# Patient Record
Sex: Female | Born: 1980 | Hispanic: Refuse to answer | Marital: Single | Smoking: Former smoker
Health system: Southern US, Community
[De-identification: ages and names within clinical notes are randomized; demographics above are authoritative.]

## PROBLEM LIST (undated history)

## (undated) DIAGNOSIS — Z789 Other specified health status: Secondary | ICD-10-CM

## (undated) HISTORY — PX: APPENDECTOMY: SHX54

---
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
## 2002-08-15 ENCOUNTER — Inpatient Hospital Stay (HOSPITAL_COMMUNITY): Admission: AD | Admit: 2002-08-15 | Discharge: 2002-08-18 | Payer: Self-pay | Admitting: Obstetrics

## 2002-08-28 ENCOUNTER — Inpatient Hospital Stay (HOSPITAL_COMMUNITY): Admission: AD | Admit: 2002-08-28 | Discharge: 2002-08-28 | Payer: Self-pay | Admitting: Obstetrics

## 2003-09-13 ENCOUNTER — Ambulatory Visit (HOSPITAL_COMMUNITY): Admission: RE | Admit: 2003-09-13 | Discharge: 2003-09-13 | Payer: Self-pay | Admitting: *Deleted

## 2003-12-26 ENCOUNTER — Inpatient Hospital Stay (HOSPITAL_COMMUNITY): Admission: AD | Admit: 2003-12-26 | Discharge: 2003-12-30 | Payer: Self-pay | Admitting: *Deleted

## 2003-12-27 ENCOUNTER — Encounter (INDEPENDENT_AMBULATORY_CARE_PROVIDER_SITE_OTHER): Payer: Self-pay | Admitting: Specialist

## 2010-05-05 ENCOUNTER — Emergency Department (HOSPITAL_COMMUNITY): Admission: EM | Admit: 2010-05-05 | Discharge: 2010-05-05 | Payer: Self-pay | Admitting: Emergency Medicine

## 2011-02-27 NOTE — Op Note (Signed)
NAME:  Linda Massey, Linda Massey                          ACCOUNT NO.:  [id number]   MEDICAL RECORD NO.:  [id number]                   PATIENT TYPE:  INP   LOCATION:  9315                                 FACILITY:  [facility]   PHYSICIAN:  Phil D. Okey Dupre, M.D.                  DATE OF BIRTH:  1981/09/10   DATE OF PROCEDURE:  12/27/2003  DATE OF DISCHARGE:                                 OPERATIVE REPORT   PREOPERATIVE DIAGNOSES:  Failed vaginal birth after cesarean section with  direct posterior malpresentation and nonreassuring fetal heart pattern.   POSTOPERATIVE DIAGNOSES:  Failed vaginal birth after cesarean section with  direct posterior malpresentation and nonreassuring fetal heart pattern.   PROCEDURE:  Low transverse cesarean section.   ANESTHESIA:  Epidural.   ESTIMATED BLOOD LOSS:  1000 mL plus.   SURGEON:  Javier Glazier. Okey Dupre, M.D.   POSTPARTUM CONDITION:  At this time satisfactory.   The procedure went as follows:  Under satisfactory epidural anesthesia with  the patient in dorsal supine position, Foley catheter in urinary bladder,  the abdomen was prepped and draped in a routine manner and entered through a  transverse low abdominal scar situated 0.5 cm above a previous C-section  scar.  The abdomen was entered in Pfannenstiel method.  On entering the  peritoneal cavity the visceral peritoneum on the anterior surface of the  uterus was opened transversely and the bladder pushed away from the lower  uterine segment.  It was entered by sharp and blunt dissection.  The baby  was jammed into the pelvis in a direct posterior presentation and we brought  up with some difficulty, and once the baby was brought out of the pelvis,  delivery through the uterine incision was quite easy.  The cord was doubly  clamped, divided, and the baby handed to the pediatrician and a little  suction with the bulb suction and cried spontaneously.  Then samples of  blood were taken for analysis and the placenta was  spontaneously removed.  The uterus was explored and there was a significant extension on the left  side __________ of the uterus, which was grasped with a ring forceps and the  uterus was then closed with a continuous running locked 0 Vicryl suture on  an atraumatic needle.  After the closure it was noted that she was still  bleeding significantly from that left side and the scrub nurse as much as  possible gave me exposure, allowed several figure-of-eight sutures to be put  in there, and seemed to control the bleeding quite well.  The area was  observed for bleeding and no further bleeding was noted of any significance.  The omentum that kept coming down was packed away with wet tapes and the  areas observed under five to seven minutes and no active bleeding noted.  The fascia was then closed after the tape, instrument, and sponge count  were  reported correct with a continuous running 0 Vicryl on an atraumatic needle  and the skin edge approximated with skin staples after the subcutaneous  bleeders were controlled with hot cautery.  A dry sterile dressing was  applied.  Tape, instrument, sponge, and needle count were reported correct  at the end of the procedure and the Foley catheter was draining clear amber  urine and had  collected 250 mL.  Estimated blood loss during the procedure by anesthesia  was 1000 mL, thought I thought it was at least 250 or 500 more than that.  The uterus was firm at the end of the procedure.  A dry sterile dressing was  applied and the patient transferred to the recovery room in satisfactory  condition.                                               Phil D. Okey Dupre, M.D.    PDR/MEDQ  D:  12/27/2003  T:  12/28/2003  Job:  [id number]

## 2011-02-27 NOTE — H&P (Signed)
   NAME:  Linda Massey, Linda Massey                          ACCOUNT NO.:  [id number]   MEDICAL RECORD NO.:  [id number]                   PATIENT TYPE:  INP   LOCATION:  9170                                 [facility]ACILITY:  [facility]   PHYSICIAN:  Kathreen Cosier, M.D.           DATE OF BIRTH:  1980-11-19   DATE OF ADMISSION:  08/15/2002  DATE OF DISCHARGE:                                HISTORY & PHYSICAL   HISTORY OF PRESENT ILLNESS:  The patient is a 30 year old primigravida, [facility]  August 19, 2002 admitted in early labor on the morning of November 4.  Cervix 1 cm, 70%, vertex, -3.  Positive GBS treated at 36 weeks with  ampicillin and penicillin regimen in the hospital.  Membranes were ruptured  artificially at 9:45 a.m.  The fluid was clear.  The patient received  Pitocin stimulation starting at 12:10 p.m.  At 8:30 p.m. she was 6 cm  dilated and by 11 p.m. she was unchanged with the vertex at -3 station with  a lot of molding.  It was decided she would be delivered by cesarean section  because of CPD.   PHYSICAL EXAMINATION:  GENERAL:  Well-developed female in labor.  HEENT:  Negative.  LUNGS:  Clear.  HEART:  Regular rhythm.  No murmurs.  No gallops.  ABDOMEN:  Term size uterus.  Estimated fetal weight was 7 pounds 2 ounces.  EXTREMITIES:  Negative.                                               Kathreen Cosier, M.D.    BAM/MEDQ  D:  08/16/2002  T:  08/16/2002  Job:  [id number]

## 2011-02-27 NOTE — Op Note (Signed)
   NAME:  Linda Massey, Linda Massey                          ACCOUNT NO.:  [id number]   MEDICAL RECORD NO.:  [id number]                   PATIENT TYPE:  INP   LOCATION:  9170                                 [facility]   PHYSICIAN:  Kathreen Cosier, M.D.           DATE OF BIRTH:  06/04/1981   DATE OF PROCEDURE:  08/15/2002  DATE OF DISCHARGE:                                 OPERATIVE REPORT   PREOPERATIVE DIAGNOSES:  Failure to progress in labor.   SURGEON:  Kathreen Cosier, M.D.   PROCEDURE:  The patient placed on the operating table in supine position.  Abdomen prepped and draped.  Bladder emptied with Foley catheter.  Transverse suprapubic incision made, carried down to the rectus fascia.  Fascia cleaned and incised the length of the incision.  Recti muscles  retracted laterally.  Peritoneum incised longitudinally.  Transverse  incision made on the visceroperitoneum above the bladder and the bladder  mobilized inferiorly.  Transverse lower uterine incision made.  The patient  delivered from the OP position of a female Apgar 9/9 weighing 7 pounds 7  ounces.  The placenta was anterior, removed manually.  Uterine cavity  cleaned with dry laps.  The fluid was clear.  The uterus closed in one layer  with continuous suture of number 1 chromic.  Hemostasis satisfactory.  Bladder flap reattached with 2-0 chromic.  Uterus well contracted.  Tubes  and ovaries normal.  Abdomen closed in layers.  Peritoneum continuous suture  of 0 chromic.  Fascia continuous suture of 0 Dexon.  Skin closed with  subcuticular suture of 3-0 plain.  Blood loss 300 cc.  The patient tolerated  procedure well.  Taken to recovery room in good condition.                                               Kathreen Cosier, M.D.    BAM/MEDQ  D:  08/16/2002  T:  08/16/2002  Job:  [id number]

## 2011-02-27 NOTE — Discharge Summary (Signed)
   NAME:  SKILA, ROLLINS                          ACCOUNT NO.:  [id number]   MEDICAL RECORD NO.:  [id number]                   PATIENT TYPE:  INP   LOCATION:  9107                                 [facility]ILITY[facility]   PHYSICIAN:  Kathreen Cosier, M.D.           DATE OF BIRTH:  Mar 27, 1981   DATE OF ADMISSION:  08/15/2002  DATE OF DISCHARGE:                                 DISCHARGE SUMMARY   HOSPITAL COURSE:  The patient is a 30 year old primigravida, [facility] 08/19/2002.  Admitted in labor, 1 cm, 70%, vertex, -3, positive GBS, treated with  ampicillin.  The patient underwent primary low transverse cesarean section  because of failure to progress in labor.  She delivered a female Apgar 9-9,  __________ from the OP position.  Placenta was anterior.   On admission, her hemoglobin was 10.6, platelets 228.  Postoperative 9.4,  194.   She did well and was discharged home on the third postoperative day  __________ on a regular diet.  On Tylox, one every 3-4 hours p.r.n.  To see  me in six weeks.   DIAGNOSIS:  Status post primary low transverse cesarean section at term.                                                Kathreen Cosier, M.D.    BAM/MEDQ  D:  08/18/2002  T:  08/20/2002  Job:  [id number]

## 2011-02-27 NOTE — Discharge Summary (Signed)
NAME:  Linda Massey, Linda Massey                          ACCOUNT NO.:  [id number]   MEDICAL RECORD NO.:  [id number]                   PATIENT TYPE:  INP   LOCATION:  9315                                 [facility]   PHYSICIAN:  Phil D. Okey Dupre, M.D.                  DATE OF BIRTH:  [birth year]   DATE OF ADMISSION:  12/26/2003  DATE OF DISCHARGE:  12/30/2003                                 DISCHARGE SUMMARY   ADMISSION DIAGNOSES:  1. Gravida 2, para 1-0-0-1 at 40-6/7 weeks with history of cesarean section     desiring trial of labor.  2. Early labor.   DISCHARGE DIAGNOSES:  1. Failed trial of labor.  2. Nonreassuring fetal heart rate.  3. Placenta posterior positioning.  4. Repeat low transverse cesarean section.   HISTORY OF PRESENT ILLNESS:  This is a 31 year old G2, P1-0-0-1 who  presented at 40-6/7 weeks dated by 26-week ultrasound.  She presented with  contractions every two to three minutes in early labor with spontaneous  rupture of membranes and clear fluid.  She had no bleeding and good fetal  activity.  She was desirous of trial of labor after cesarean.  Her prenatal  care was at [facility], onset about 26 weeks, and uncomplicated course.   MEDICATIONS:  Vitamins _________.   OBSTETRICAL HISTORY:  Low transverse cesarean section in November of 2003 at  term for arrest of progress.  Birth weight was 7 pounds 8 ounces.   GYNECOLOGICAL HISTORY:  Noncontributory.   PAST MEDICAL HISTORY:  History of periumbilical hernia.   PAST SURGICAL HISTORY:  Low transverse cesarean section, Dr. Kathreen Cosier.   FAMILY HISTORY:  Paternal uncle with heart defect.  Mother with  hypertension.  Negative tobacco, alcohol, or drugs.  Rh positive.  Rubella  immune.  GBS was negative on November 01, 2003.  Her Glucola was 103.   ADMISSION PHYSICAL EXAMINATION:  VITAL SIGNS:  Temperature 99, blood  pressure 137/90 initially.  LUNGS:  Clear.  CARDIOVASCULAR:  Regular rate and rhythm.  ABDOMEN:  Term size and gravid.  PELVIC:  Digital examination by Dr. Okey Dupre was 3, 80%, -2 with gross amniotic  fluid.  Fetal heart rate was 140, reactive, good variability and  contractions were every two to three minutes.   HOSPITAL COURSE:  The patient was initially given IV anesthesia and blood  pressures were remaining in the 137 to 162/75 to 91 range so PIH labs were  drawn as well as catheterized urine for protein.  These labs were all well  within normal limits and there was negative proteinuria.  She had no  preeclampsia symptoms and no hyperreflexia.  She progressed in early labor  and mild gestational hypertension remained stable.  Her records were  reviewed.  Her maximum dilatation with the 2003 delivery was 6 and there was  molding and she remained at 6 for several  hours before her primary cesarean  section.  She had mild variables but was progressing in early labor.  When  she became 4 cm, 100% she was started on low dose Pitocin.  Again, this was  initially stopped due to mild repetitive variables.  IUPC and IFSE were  placed in active labor and [place] units were adequate at 200.  She  remained at 8 cm and -2 station for approximately two hours with 4+ caput  __________ direct OP positioning.  There was decreased variabilities of the  fetal heart rate and a decision was made to proceed with second low  transverse cesarean section due to nonreassuring fetal heart rate,  persistent posterior presentation and she went to the OR with epidural  anesthesia effective.  Her estimated blood loss was 1000.  A low transverse  cesarean section was done.   Postoperatively she did well.  Her hemoglobin was 11.4.  Vital signs  remained stable.   On postoperative day #3, her pain was well controlled.  She had no  orthostatic symptoms, was tolerating regular diet and had normal bowel  function.  She had changed to bottle-feeding.  Vital signs remained stable.  It was noted that there  was Trichomonas on her urinalysis.  Abdomen was  soft, nondistended, appropriate tender and involuting incision with staples  clean, dry and intact.  Decision was made to discharge home on Percocet,  ibuprofen, prenatal vitamins, iron, Depo-Provera and Flagyl 2 g p.o. for her  Trichomonas infection.  Staples were removed from the incision predischarge.  She was to follow up at [facility] in six weeks and discharged home in  good condition.     Deirdre Christy Gentles, C.N.M.                       Phil D. Okey Dupre, M.D.    DP/MEDQ  D:  02/25/2004  T:  02/25/2004  Job:  [id number]

## 2011-10-23 ENCOUNTER — Encounter (HOSPITAL_COMMUNITY): Payer: Self-pay | Admitting: *Deleted

## 2011-10-23 ENCOUNTER — Emergency Department (HOSPITAL_COMMUNITY)
Admission: EM | Admit: 2011-10-23 | Discharge: 2011-10-23 | Disposition: A | Discharge status: Home / Self Care | Payer: Self-pay | Attending: Emergency Medicine | Admitting: Emergency Medicine

## 2011-10-23 ENCOUNTER — Emergency Department (HOSPITAL_COMMUNITY): Payer: Self-pay

## 2011-10-23 DIAGNOSIS — R0789 Other chest pain: Secondary | ICD-10-CM

## 2011-10-23 DIAGNOSIS — R071 Chest pain on breathing: Secondary | ICD-10-CM | POA: Insufficient documentation

## 2011-10-23 DIAGNOSIS — R509 Fever, unspecified: Secondary | ICD-10-CM | POA: Insufficient documentation

## 2011-10-23 DIAGNOSIS — R1110 Vomiting, unspecified: Secondary | ICD-10-CM | POA: Insufficient documentation

## 2011-10-23 DIAGNOSIS — R059 Cough, unspecified: Secondary | ICD-10-CM | POA: Insufficient documentation

## 2011-10-23 DIAGNOSIS — F17200 Nicotine dependence, unspecified, uncomplicated: Secondary | ICD-10-CM | POA: Insufficient documentation

## 2011-10-23 DIAGNOSIS — N390 Urinary tract infection, site not specified: Secondary | ICD-10-CM | POA: Insufficient documentation

## 2011-10-23 DIAGNOSIS — R51 Headache: Secondary | ICD-10-CM | POA: Insufficient documentation

## 2011-10-23 DIAGNOSIS — R05 Cough: Secondary | ICD-10-CM | POA: Insufficient documentation

## 2011-10-23 DIAGNOSIS — B349 Viral infection, unspecified: Secondary | ICD-10-CM

## 2011-10-23 DIAGNOSIS — B9789 Other viral agents as the cause of diseases classified elsewhere: Secondary | ICD-10-CM | POA: Insufficient documentation

## 2011-10-23 DIAGNOSIS — R0602 Shortness of breath: Secondary | ICD-10-CM | POA: Insufficient documentation

## 2011-10-23 LAB — URINALYSIS, ROUTINE W REFLEX MICROSCOPIC
Bilirubin Urine: NEGATIVE
Glucose, UA: NEGATIVE mg/dL
Hgb urine dipstick: NEGATIVE
Ketones, ur: NEGATIVE mg/dL
Nitrite: NEGATIVE
Protein, ur: NEGATIVE mg/dL
Specific Gravity, Urine: 1.023 (ref 1.005–1.030)
Urobilinogen, UA: 1 mg/dL (ref 0.0–1.0)
pH: 8 (ref 5.0–8.0)

## 2011-10-23 LAB — URINE MICROSCOPIC-ADD ON

## 2011-10-23 LAB — POCT PREGNANCY, URINE: Preg Test, Ur: NEGATIVE

## 2011-10-23 MED ORDER — HYDROCOD POLST-CHLORPHEN POLST 10-8 MG/5ML PO LQCR: 5.0000 mL | Freq: Two times a day (BID) | ORAL | Status: DC | PRN

## 2011-10-23 MED ORDER — ACETAMINOPHEN 325 MG PO TABS
650.0000 mg | ORAL_TABLET | Freq: Once | ORAL | Status: AC
  Administered 2011-10-23: 650 mg via ORAL
  Filled 2011-10-23: qty 2

## 2011-10-23 MED ORDER — HYDROCOD POLST-CHLORPHEN POLST 10-8 MG/5ML PO LQCR
5.0000 mL | Freq: Once | ORAL | Status: AC
  Administered 2011-10-23: 5 mL via ORAL
  Filled 2011-10-23: qty 5

## 2011-10-23 MED ORDER — IPRATROPIUM BROMIDE 0.02 % IN SOLN
0.5000 mg | Freq: Once | RESPIRATORY_TRACT | Status: AC
  Administered 2011-10-23: 0.5 mg via RESPIRATORY_TRACT
  Filled 2011-10-23: qty 2.5

## 2011-10-23 MED ORDER — NITROFURANTOIN MONOHYD MACRO 100 MG PO CAPS: 100.0000 mg | ORAL_CAPSULE | Freq: Two times a day (BID) | ORAL | Status: AC

## 2011-10-23 MED ORDER — ALBUTEROL SULFATE (5 MG/ML) 0.5% IN NEBU
5.0000 mg | INHALATION_SOLUTION | Freq: Once | RESPIRATORY_TRACT | Status: AC
  Administered 2011-10-23: 5 mg via RESPIRATORY_TRACT
  Filled 2011-10-23: qty 1

## 2011-10-23 MED ORDER — ONDANSETRON 4 MG PO TBDP
8.0000 mg | ORAL_TABLET | Freq: Once | ORAL | Status: AC
  Administered 2011-10-23: 8 mg via ORAL
  Filled 2011-10-23: qty 2

## 2011-10-23 NOTE — ED Notes (Signed)
Patient states that she has been feeling bad since yesterday morning at 8am  States she has been having trouble breathing at times.  Sounds congested at this time  Also stated that she vomited earlier today.

## 2011-10-23 NOTE — ED Provider Notes (Signed)
Medical screening examination/treatment/procedure(s) were performed by non-physician practitioner and as supervising physician I was immediately available for consultation/collaboration.  Emi Lymon K Cali Cuartas-Rasch, MD 10/23/11 2322 

## 2011-10-23 NOTE — ED Notes (Signed)
The pt is c/o nausea and vomiting since 0800am no diarrhea.  She has started having chest pain after the vomiting

## 2011-10-23 NOTE — ED Provider Notes (Signed)
History     CSN: [id number]  Arrival date & time 10/23/11  0007   First MD Initiated Contact with Patient 10/23/11 0315      Chief Complaint  Patient presents with  . Emesis    HPI: Patient is a 31 y.o. female presenting with cough.  Cough This is a new problem. The current episode started 12 to 24 hours ago. The problem occurs every few minutes. The problem has been gradually worsening. The cough is non-productive. There has been no fever. Associated symptoms include chest pain and headaches. Pertinent negatives include no chills, no ear pain, no sore throat, no myalgias, no shortness of breath and no wheezing. She has tried nothing for the symptoms.  Pt reports onset of persistent "hard" cough yesterday. States she coughs to point of vomiting. This has happened several times since onset. Also states her chest hurts when she coughs. Now states has headache from coughing.  History reviewed. No pertinent past medical history.  History reviewed. No pertinent past surgical history.  History reviewed. No pertinent family history.  History  Substance Use Topics  . Smoking status: Current Everyday Smoker  . Smokeless tobacco: Not on file  . Alcohol Use: No    OB History    Grav Para Term Preterm Abortions TAB SAB Ect Mult Living                  Review of Systems  Constitutional: Negative for fever and chills.  HENT: Positive for congestion. Negative for ear pain and sore throat.   Respiratory: Positive for cough. Negative for shortness of breath and wheezing.   Cardiovascular: Positive for chest pain.  Gastrointestinal: Positive for vomiting. Negative for abdominal pain, diarrhea and anal bleeding.  Genitourinary: Negative for dysuria and vaginal discharge.  Musculoskeletal: Negative for myalgias.  Neurological: Positive for headaches.    Allergies  Review of patient's allergies indicates no known allergies.  Home Medications   Current Outpatient Rx  Name Route Sig  Dispense Refill  . NAPROXEN SODIUM 220 MG PO TABS Oral Take 660 mg by mouth once. Took once for chest pain    . HYDROCOD POLST-CPM POLST ER 10-8 MG/5ML PO LQCR Oral Take 5 mLs by mouth every 12 (twelve) hours as needed. 30 mL 0  . NITROFURANTOIN MONOHYD MACRO 100 MG PO CAPS Oral Take 1 capsule (100 mg total) by mouth 2 (two) times daily. 14 capsule 0    BP 117/74  Pulse 99  Temp(Src) 99.7 F (37.6 C) (Oral)  Resp 20  SpO2 99%  LMP 10/07/2011  Physical Exam  Constitutional: She is oriented to person, place, and time. She appears well-developed and well-nourished.  HENT:  Head: Normocephalic and atraumatic.  Eyes: Conjunctivae are normal.  Neck: Neck supple.  Cardiovascular: Normal rate and regular rhythm.   Pulmonary/Chest: Effort normal and breath sounds normal.       Frequent coughing. Mild chest wall TTP  Abdominal: Soft. Bowel sounds are normal.  Musculoskeletal: Normal range of motion.  Neurological: She is alert and oriented to person, place, and time.  Skin: Skin is warm and dry. No erythema.  Psychiatric: She has a normal mood and affect.    ED Course  Procedures Pt reports some relief of coughing w/ neb and medication for cough.Some improvement in headache. Findings and clinical impression discussed w/ pt. Pt continues to deny dysuria, abd pain or vag d/c. Will plan for d/c home w/ tx for UTI and medication for cough and provide PCP  referrals. Pt agreeable w/ plan.  Labs Reviewed  URINALYSIS, ROUTINE W REFLEX MICROSCOPIC - Abnormal; Notable for the following:    APPearance CLOUDY (*)    Leukocytes, UA LARGE (*)    All other components within normal limits  URINE MICROSCOPIC-ADD ON - Abnormal; Notable for the following:    Squamous Epithelial / LPF MANY (*)    Bacteria, UA MANY (*)    All other components within normal limits  POCT PREGNANCY, URINE  POCT PREGNANCY, URINE  I-STAT, CHEM 8   Dg Chest 2 View  10/23/2011  *RADIOLOGY REPORT*  Clinical Data: Cough.   Short of breath.  Fever.  CHEST - 2 VIEW  Comparison: 10/23/2011.  Findings:  Cardiopericardial silhouette within normal limits. Mediastinal contours normal. Trachea midline.  No airspace disease or effusion. No interval change.  IMPRESSION: Negative two-view chest.  Original Report Authenticated By: Andreas Newport, M.D.   Dg Chest 2 View  10/23/2011  *RADIOLOGY REPORT*  Clinical Data: Cough with fever for 2 days.  CHEST - 2 VIEW  Comparison: None.  Findings:  Cardiopericardial silhouette within normal limits. Mediastinal contours normal. Trachea midline.  No airspace disease or effusion.  IMPRESSION: No acute cardiopulmonary disease.  Original Report Authenticated By: Andreas Newport, M.D.     1. UTI (lower urinary tract infection)   2. Cough   3. Upper respiratory infection       MDM  HPI/PE and clinical findings c/w  1. Cough/headache likely viral syndrome (pt w/ low grade fever, not tachycardic and sats 96% on R/A) 2. UTI  3. Chest wall pain (likely due to peristent cough)        Leanne Chang, NP 10/23/11 [id number]

## 2011-10-23 NOTE — ED Notes (Signed)
Pt c/o a migraine headache 

## 2012-06-06 ENCOUNTER — Encounter (HOSPITAL_COMMUNITY): Payer: Self-pay

## 2012-06-06 ENCOUNTER — Emergency Department (HOSPITAL_COMMUNITY): Payer: Medicaid Other

## 2012-06-06 ENCOUNTER — Emergency Department (HOSPITAL_COMMUNITY)
Admission: EM | Admit: 2012-06-06 | Discharge: 2012-06-06 | Disposition: A | Discharge status: Home / Self Care | Payer: Medicaid Other | Attending: Emergency Medicine | Admitting: Emergency Medicine

## 2012-06-06 DIAGNOSIS — M25539 Pain in unspecified wrist: Secondary | ICD-10-CM | POA: Insufficient documentation

## 2012-06-06 DIAGNOSIS — F17200 Nicotine dependence, unspecified, uncomplicated: Secondary | ICD-10-CM | POA: Insufficient documentation

## 2012-06-06 DIAGNOSIS — M25531 Pain in right wrist: Secondary | ICD-10-CM

## 2012-06-06 MED ORDER — IBUPROFEN 400 MG PO TABS
800.0000 mg | ORAL_TABLET | Freq: Once | ORAL | Status: AC
  Administered 2012-06-06: 800 mg via ORAL
  Filled 2012-06-06: qty 2

## 2012-06-06 MED ORDER — IBUPROFEN 800 MG PO TABS: 800.0000 mg | ORAL_TABLET | Freq: Three times a day (TID) | ORAL | Status: AC | PRN
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
## 2012-06-06 NOTE — ED Notes (Signed)
Returned from  X-ray
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
## 2012-06-06 NOTE — Progress Notes (Signed)
Orthopedic Tech Progress Note Patient Details:  Linda Massey 1981/02/25 [id number]  Ortho Devices Type of Ortho Device: Ace wrap Ortho Device/Splint Location: right hand Ortho Device/Splint Interventions: Application   [employer] T 06/06/2012, 2:49 PM

## 2012-06-06 NOTE — ED Notes (Signed)
Ortho tech notified of need for ace wrap

## 2012-06-07 NOTE — ED Provider Notes (Signed)
Medical screening examination/treatment/procedure(s) were performed by non-physician practitioner and as supervising physician I was immediately available for consultation/collaboration.   Mercadez Heitman Y. Reannon Candella, MD 06/07/12 1623 

## 2013-09-26 ENCOUNTER — Emergency Department (HOSPITAL_COMMUNITY)
Admission: EM | Admit: 2013-09-26 | Discharge: 2013-09-26 | Disposition: A | Discharge status: Home / Self Care | Payer: Medicaid Other | Attending: Emergency Medicine | Admitting: Emergency Medicine

## 2013-09-26 ENCOUNTER — Encounter (HOSPITAL_COMMUNITY): Payer: Self-pay | Admitting: Emergency Medicine

## 2013-09-26 DIAGNOSIS — M549 Dorsalgia, unspecified: Secondary | ICD-10-CM

## 2013-09-26 DIAGNOSIS — Z880 Allergy status to penicillin: Secondary | ICD-10-CM | POA: Insufficient documentation

## 2013-09-26 DIAGNOSIS — M545 Low back pain, unspecified: Secondary | ICD-10-CM | POA: Insufficient documentation

## 2013-09-26 DIAGNOSIS — F17200 Nicotine dependence, unspecified, uncomplicated: Secondary | ICD-10-CM | POA: Insufficient documentation

## 2013-09-26 MED ORDER — HYDROCODONE-ACETAMINOPHEN 5-325 MG PO TABS: 2.0000 | ORAL_TABLET | ORAL | Status: DC | PRN

## 2013-09-26 MED ORDER — KETOROLAC TROMETHAMINE 60 MG/2ML IM SOLN
60.0000 mg | Freq: Once | INTRAMUSCULAR | Status: AC
  Administered 2013-09-26: 60 mg via INTRAMUSCULAR
  Filled 2013-09-26: qty 2

## 2013-09-26 MED ORDER — DIAZEPAM 5 MG PO TABS
5.0000 mg | ORAL_TABLET | Freq: Once | ORAL | Status: AC
  Administered 2013-09-26: 5 mg via ORAL
  Filled 2013-09-26: qty 1

## 2013-09-26 MED ORDER — DIAZEPAM 5 MG PO TABS: 5.0000 mg | ORAL_TABLET | Freq: Two times a day (BID) | ORAL | Status: DC

## 2013-09-26 NOTE — ED Notes (Signed)
Per EMS: Pt c/o lower back pain that began yesterday. Pt denies injury or trauma. Pt reports that the pain radiates down her left leg. Pt reports taking ibuprofen eight hours ago, which did not decrease her pain. Pt is A/O x4 and in NAD.

## 2013-09-26 NOTE — ED Provider Notes (Signed)
CSN: [id number]     Arrival date & time 09/26/13  1452 History  This chart was scribed for non-physician practitioner Irish Elders, NP working with Ross Marcus, MD by Danella Maiers, ED Scribe. This patient was seen in room WTR6/WTR6 and the patient's care was started at 3:57 PM.   Chief Complaint  Patient presents with  . Back Pain   The history is provided by the patient. No language interpreter was used.   HPI Comments: Linda Massey is a 32 y.o. female who presents to the Emergency Department complaining of left lower back pain that radiates into her left leg onset yesterday morning when she woke up. She states the pain is made worse by movement and walking. She reports having back pain in the past relieved by ibuprofen but states this is much worse and not relieved by ibuprofen. She denies any injuries, falls, or trauma. She denies fever, chills, nausea, vomiting, diarrhea, recent illness, dysuria, urinary or bowel incontinence, numbness or tingling. She is otherwise healthy. She is not on any medications currently.   History reviewed. No pertinent past medical history. No past surgical history on file. No family history on file. History  Substance Use Topics  . Smoking status: Current Every Day Smoker  . Smokeless tobacco: Not on file  . Alcohol Use: No   OB History   Grav Para Term Preterm Abortions TAB SAB Ect Mult Living                 Review of Systems  Constitutional: Negative for fever and chills.  Gastrointestinal: Negative for nausea, vomiting and diarrhea.  Genitourinary: Negative for dysuria.  Neurological: Negative for numbness.  All other systems reviewed and are negative.    Allergies  Penicillins  Home Medications   Current Outpatient Rx  Name  Route  Sig  Dispense  Refill  . ibuprofen (ADVIL,MOTRIN) 200 MG tablet   Oral   Take 400 mg by mouth every 6 (six) hours as needed (pain).          BP 113/71  Pulse 81  Temp(Src) 98.1 F (36.7 C)  (Oral)  Resp 16  SpO2 100%  LMP 09/03/2013 Physical Exam  Nursing note and vitals reviewed. Constitutional: She is oriented to person, place, and time. She appears well-developed and well-nourished. No distress.  HENT:  Head: Normocephalic and atraumatic.  Eyes: EOM are normal.  Neck: Neck supple. No tracheal deviation present.  Cardiovascular: Normal rate.   Pulmonary/Chest: Effort normal. No respiratory distress.  Musculoskeletal: Normal range of motion.  Left paravertebral tenderness into the left buttock. No midline tenderness or step-offs.   Neurological: She is alert and oriented to person, place, and time.  Skin: Skin is warm and dry.  Psychiatric: She has a normal mood and affect. Her behavior is normal.    ED Course  Procedures (including critical care time) Medications - No data to display  DIAGNOSTIC STUDIES: Oxygen Saturation is 100% on RA, normal by my interpretation.    COORDINATION OF CARE: 4:00 PM- Discussed treatment plan with pt which includes Toradol injection and Valium. Pt agrees to plan.    Labs Review Labs Reviewed - No data to display Imaging Review No results found.  EKG Interpretation   None       MDM   1. Back pain    Feeling some relief after toradol and valium. No numbness or tingling. No focal weakness or deficits. Prescriptions for valium and norco. Need to establish and follow-up with  PCP for ongoing management.  I personally performed the services described in this documentation, which was scribed in my presence. The recorded information has been reviewed and is accurate.    Irish Elders, NP 09/30/13 [phone]

## 2013-10-02 NOTE — ED Provider Notes (Signed)
Medical screening examination/treatment/procedure(s) were performed by non-physician practitioner and as supervising physician I was immediately available for consultation/collaboration.  EKG Interpretation   None         Shon Baton, MD 10/02/13 [phone]

## 2014-02-05 IMAGING — CR DG WRIST COMPLETE 3+V*R*
4 series · 4 of 4 positions shown · non-contrast
Comparison: None

CLINICAL DATA: Distal radial pain, fell off porch

RIGHT WRIST - COMPLETE 3+ VIEW

[x wrist pa right]
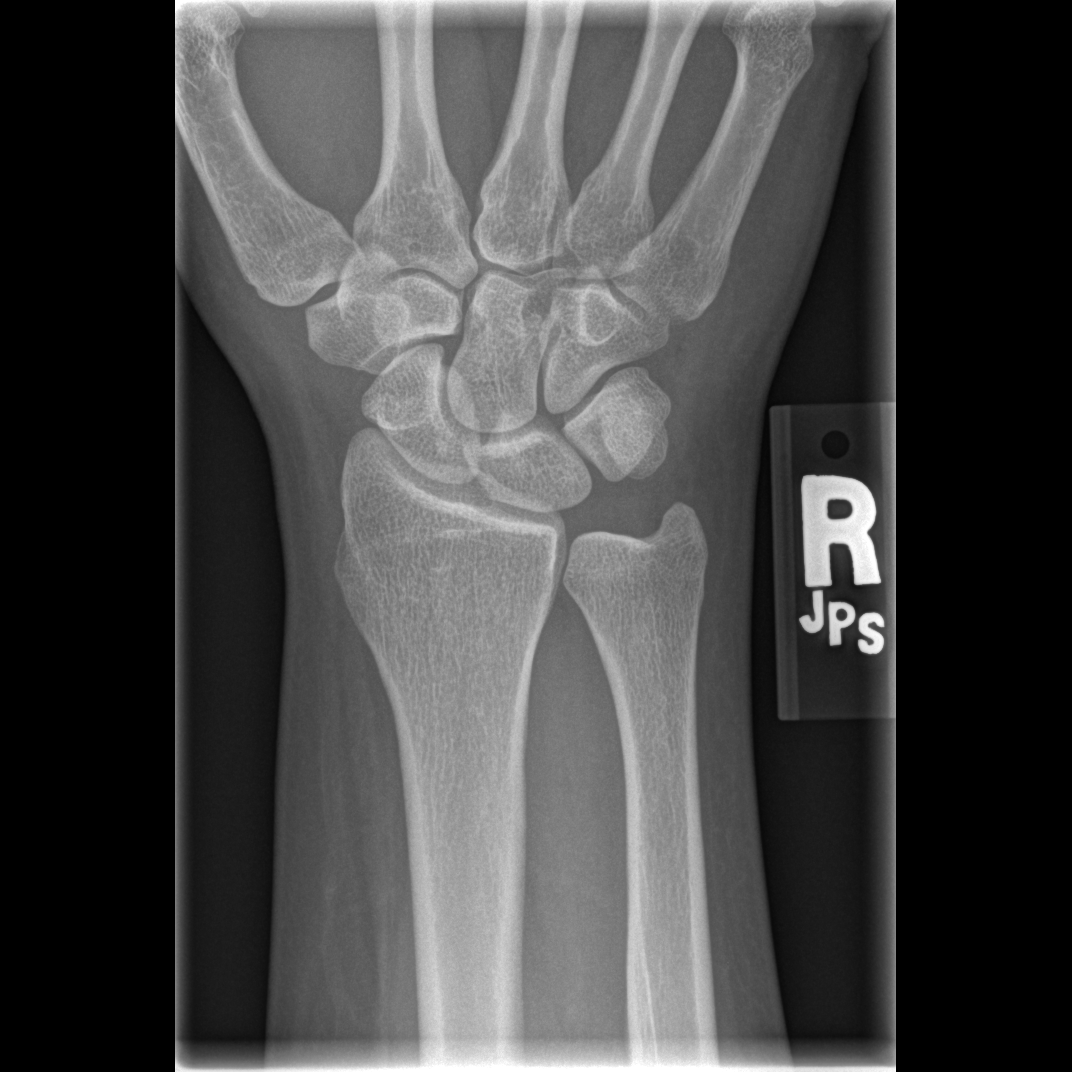

[x wrist obl right]
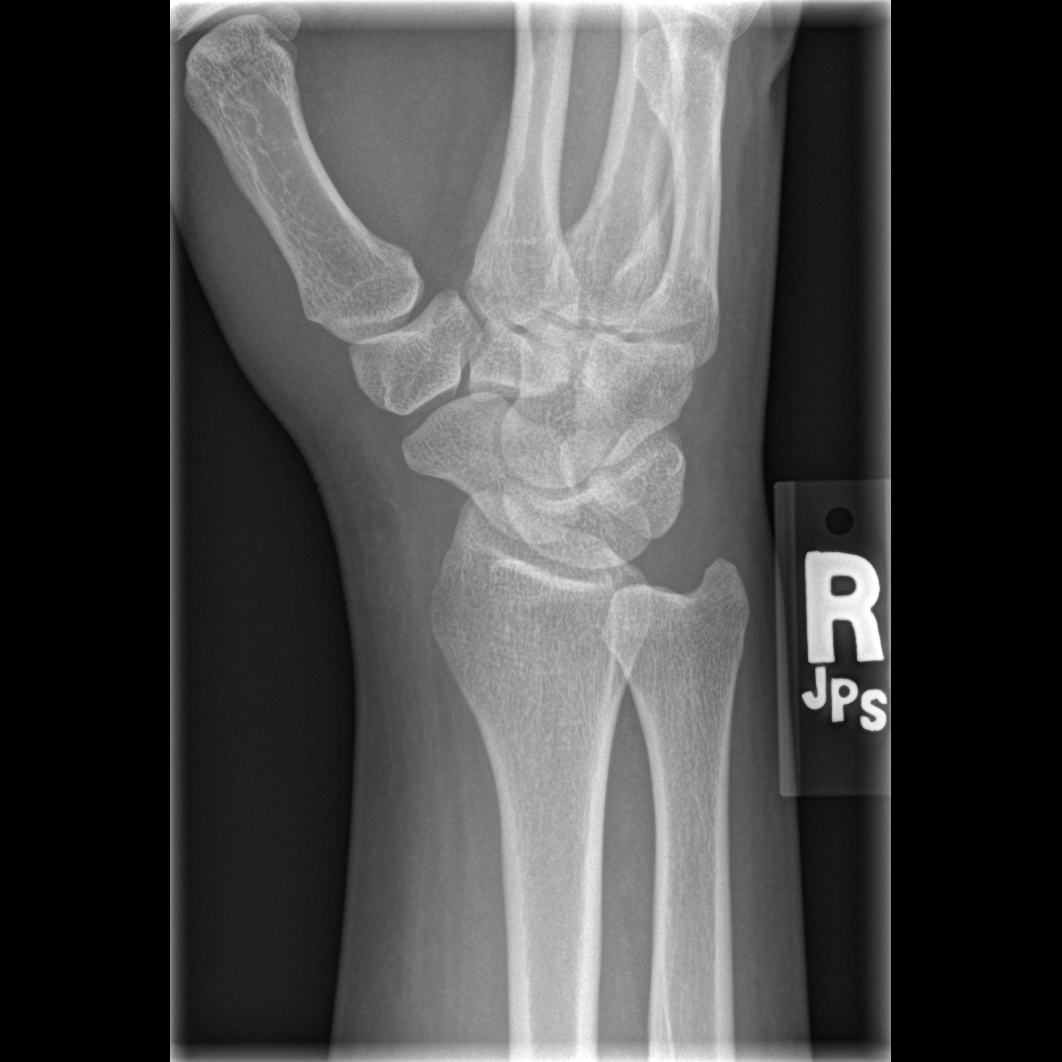

[x wrist lat right]
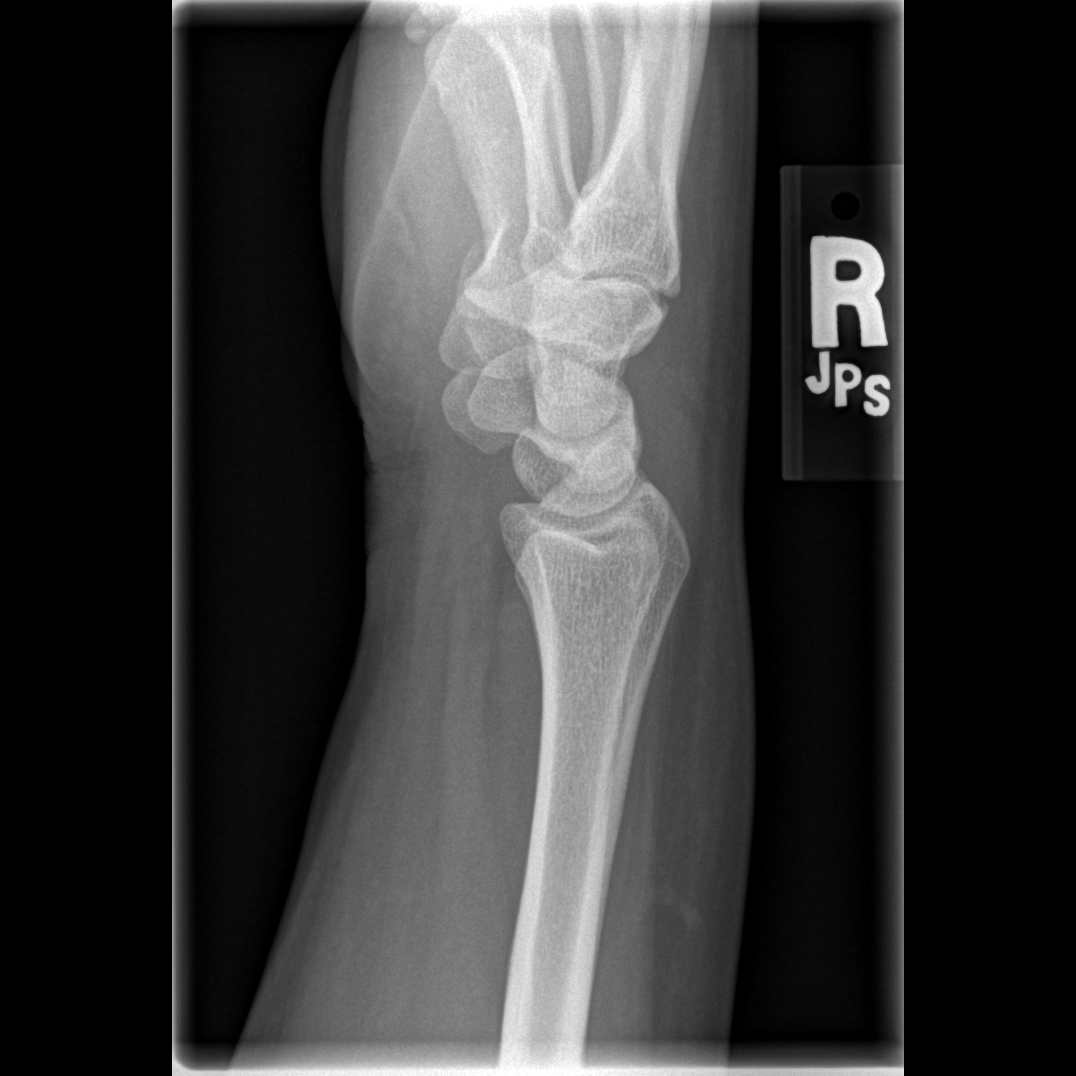

[x navicular]
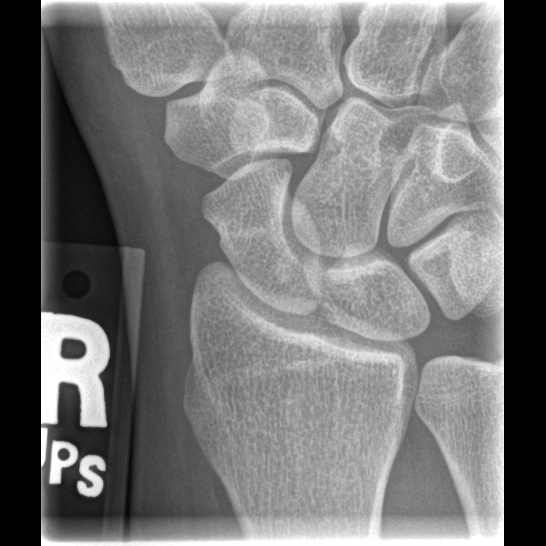

[4 of 4 positions shown; findings below may reference images not displayed]

FINDINGS: Osseous mineralization normal.
Joint spaces preserved.
No acute fracture, dislocation or bone destruction.
IMPRESSION: No acute osseous abnormalities.

## 2014-05-18 ENCOUNTER — Encounter (HOSPITAL_COMMUNITY): Payer: Self-pay | Admitting: Emergency Medicine

## 2014-05-18 ENCOUNTER — Inpatient Hospital Stay (HOSPITAL_COMMUNITY): Payer: Self-pay

## 2014-05-18 ENCOUNTER — Inpatient Hospital Stay (HOSPITAL_COMMUNITY): Payer: Self-pay | Admitting: Anesthesiology

## 2014-05-18 ENCOUNTER — Encounter (HOSPITAL_COMMUNITY): Payer: Self-pay | Admitting: Anesthesiology

## 2014-05-18 ENCOUNTER — Encounter (HOSPITAL_COMMUNITY): Payer: Self-pay

## 2014-05-18 ENCOUNTER — Encounter (HOSPITAL_COMMUNITY)
Admission: AD | Disposition: A | Discharge status: Home / Self Care | Payer: Self-pay | Source: Ambulatory Visit | Attending: Obstetrics & Gynecology

## 2014-05-18 ENCOUNTER — Ambulatory Visit (HOSPITAL_COMMUNITY)
Admission: AD | Admit: 2014-05-18 | Discharge: 2014-05-19 | Disposition: A | Discharge status: Home / Self Care | Payer: Self-pay | Source: Ambulatory Visit | Attending: Obstetrics & Gynecology | Admitting: Obstetrics & Gynecology

## 2014-05-18 ENCOUNTER — Emergency Department (INDEPENDENT_AMBULATORY_CARE_PROVIDER_SITE_OTHER)
Admission: EM | Admit: 2014-05-18 | Discharge: 2014-05-18 | Disposition: A | Discharge status: Nursing Home (Includes ICF) | Payer: Self-pay | Source: Home / Self Care | Attending: Emergency Medicine | Admitting: Emergency Medicine

## 2014-05-18 DIAGNOSIS — R1031 Right lower quadrant pain: Secondary | ICD-10-CM

## 2014-05-18 DIAGNOSIS — O00109 Unspecified tubal pregnancy without intrauterine pregnancy: Secondary | ICD-10-CM | POA: Diagnosis present

## 2014-05-18 DIAGNOSIS — O009 Unspecified ectopic pregnancy without intrauterine pregnancy: Secondary | ICD-10-CM

## 2014-05-18 DIAGNOSIS — Z87891 Personal history of nicotine dependence: Secondary | ICD-10-CM | POA: Insufficient documentation

## 2014-05-18 DIAGNOSIS — Z880 Allergy status to penicillin: Secondary | ICD-10-CM | POA: Insufficient documentation

## 2014-05-18 HISTORY — PX: LAPAROSCOPY: SHX197

## 2014-05-18 HISTORY — DX: Other specified health status: Z78.9

## 2014-05-18 LAB — POCT URINALYSIS DIP (DEVICE)
Glucose, UA: 100 mg/dL — AB
Nitrite: NEGATIVE
Protein, ur: 30 mg/dL — AB
Specific Gravity, Urine: 1.02 (ref 1.005–1.030)
Urobilinogen, UA: 1 mg/dL (ref 0.0–1.0)
pH: 7 (ref 5.0–8.0)

## 2014-05-18 LAB — ABO/RH: ABO/RH(D): A POS

## 2014-05-18 LAB — CBC
HCT: 33 % — ABNORMAL LOW (ref 36.0–46.0)
Hemoglobin: 11.1 g/dL — ABNORMAL LOW (ref 12.0–15.0)
MCH: 28.2 pg (ref 26.0–34.0)
MCHC: 33.6 g/dL (ref 30.0–36.0)
MCV: 83.8 fL (ref 78.0–100.0)
PLATELETS: 218 10*3/uL (ref 150–400)
RBC: 3.94 MIL/uL (ref 3.87–5.11)
RDW: 12.9 % (ref 11.5–15.5)
WBC: 10.5 10*3/uL (ref 4.0–10.5)

## 2014-05-18 LAB — HCG, QUANTITATIVE, PREGNANCY: hCG, Beta Chain, Quant, S: 10385 m[IU]/mL — ABNORMAL HIGH (ref <5)

## 2014-05-18 LAB — POCT PREGNANCY, URINE: Preg Test, Ur: POSITIVE — AB

## 2014-05-18 SURGERY — LAPAROSCOPY, DIAGNOSTIC
Anesthesia: General | Site: Abdomen | Laterality: Right

## 2014-05-18 MED ORDER — FENTANYL CITRATE 0.05 MG/ML IJ SOLN
INTRAMUSCULAR | Status: AC
  Filled 2014-05-18: qty 5

## 2014-05-18 MED ORDER — HYDROMORPHONE HCL PF 1 MG/ML IJ SOLN
1.0000 mg | Freq: Once | INTRAMUSCULAR | Status: AC
  Administered 2014-05-18: 1 mg via INTRAMUSCULAR

## 2014-05-18 MED ORDER — LACTATED RINGERS IR SOLN
Status: DC | PRN
  Administered 2014-05-18: 3000 mL

## 2014-05-18 MED ORDER — CITRIC ACID-SODIUM CITRATE 334-500 MG/5ML PO SOLN
30.0000 mL | Freq: Once | ORAL | Status: AC
  Administered 2014-05-18: 30 mL via ORAL
  Filled 2014-05-18: qty 15

## 2014-05-18 MED ORDER — SUCCINYLCHOLINE CHLORIDE 20 MG/ML IJ SOLN
INTRAMUSCULAR | Status: DC | PRN
  Administered 2014-05-18: 130 mg via INTRAVENOUS

## 2014-05-18 MED ORDER — HYDROMORPHONE HCL PF 1 MG/ML IJ SOLN
INTRAMUSCULAR | Status: AC
  Filled 2014-05-18: qty 1

## 2014-05-18 MED ORDER — ROCURONIUM BROMIDE 100 MG/10ML IV SOLN
INTRAVENOUS | Status: AC
  Filled 2014-05-18: qty 1

## 2014-05-18 MED ORDER — OXYCODONE-ACETAMINOPHEN 5-325 MG PO TABS: 1.0000 | ORAL_TABLET | ORAL | Status: DC | PRN

## 2014-05-18 MED ORDER — ONDANSETRON 4 MG PO TBDP
ORAL_TABLET | ORAL | Status: AC
  Filled 2014-05-18: qty 2

## 2014-05-18 MED ORDER — PROPOFOL 10 MG/ML IV BOLUS
INTRAVENOUS | Status: DC | PRN
  Administered 2014-05-18: 160 mg via INTRAVENOUS
  Administered 2014-05-18: 40 mg via INTRAVENOUS

## 2014-05-18 MED ORDER — HYDROMORPHONE HCL PF 1 MG/ML IJ SOLN
INTRAMUSCULAR | Status: DC | PRN
  Administered 2014-05-18: 1 mg via INTRAVENOUS

## 2014-05-18 MED ORDER — DEXAMETHASONE SODIUM PHOSPHATE 10 MG/ML IJ SOLN
INTRAMUSCULAR | Status: DC | PRN
  Administered 2014-05-18: 4 mg via INTRAVENOUS

## 2014-05-18 MED ORDER — SCOPOLAMINE 1 MG/3DAYS TD PT72SCOPOLAMINE 1 MG/3DAYS
MEDICATED_PATCH | TRANSDERMAL | Status: DC | PRN
Start: 2014-05-18 — End: 2014-05-18
  Administered 2014-05-18: 1 via TRANSDERMAL

## 2014-05-18 MED ORDER — ONDANSETRON HCL 4 MG/2ML IJ SOLN
INTRAMUSCULAR | Status: AC
  Filled 2014-05-18: qty 2

## 2014-05-18 MED ORDER — GLYCOPYRROLATE 0.2 MG/ML IJ SOLN
INTRAMUSCULAR | Status: DC | PRN
  Administered 2014-05-18: .6 mg via INTRAVENOUS
  Administered 2014-05-18: 0.1 mg via INTRAVENOUS

## 2014-05-18 MED ORDER — SODIUM CHLORIDE 0.9 % IV SOLN
INTRAVENOUS | Status: DC
  Administered 2014-05-18: 18:00:00 via INTRAVENOUS

## 2014-05-18 MED ORDER — LACTATED RINGERS IV SOLN: INTRAVENOUS | Status: DC

## 2014-05-18 MED ORDER — OXYCODONE-ACETAMINOPHEN 5-325 MG PO TABS: 1.0000 | ORAL_TABLET | Freq: Four times a day (QID) | ORAL | Status: DC | PRN

## 2014-05-18 MED ORDER — ONDANSETRON HCL 4 MG/2ML IJ SOLN
INTRAMUSCULAR | Status: DC | PRN
  Administered 2014-05-18: 4 mg via INTRAVENOUS

## 2014-05-18 MED ORDER — PROPOFOL 10 MG/ML IV EMUL
INTRAVENOUS | Status: AC
  Filled 2014-05-18: qty 20

## 2014-05-18 MED ORDER — IBUPROFEN 600 MG PO TABS: 600.0000 mg | ORAL_TABLET | Freq: Four times a day (QID) | ORAL | Status: AC | PRN

## 2014-05-18 MED ORDER — NEOSTIGMINE METHYLSULFATE 10 MG/10ML IV SOLN
INTRAVENOUS | Status: AC
  Filled 2014-05-18: qty 1

## 2014-05-18 MED ORDER — LACTATED RINGERS IV SOLN
INTRAVENOUS | Status: DC
  Administered 2014-05-18 (×3): via INTRAVENOUS

## 2014-05-18 MED ORDER — MIDAZOLAM HCL 2 MG/2ML IJ SOLN
INTRAMUSCULAR | Status: AC
  Filled 2014-05-18: qty 2

## 2014-05-18 MED ORDER — MIDAZOLAM HCL 2 MG/2ML IJ SOLN
INTRAMUSCULAR | Status: DC | PRN
  Administered 2014-05-18: 2 mg via INTRAVENOUS

## 2014-05-18 MED ORDER — DEXAMETHASONE SODIUM PHOSPHATE 10 MG/ML IJ SOLN
INTRAMUSCULAR | Status: AC
  Filled 2014-05-18: qty 1

## 2014-05-18 MED ORDER — NEOSTIGMINE METHYLSULFATE 10 MG/10ML IV SOLN
INTRAVENOUS | Status: DC | PRN
  Administered 2014-05-18: 3 mg via INTRAVENOUS

## 2014-05-18 MED ORDER — FAMOTIDINE IN NACL 20-0.9 MG/50ML-% IV SOLN
20.0000 mg | Freq: Once | INTRAVENOUS | Status: AC
  Administered 2014-05-18: 20 mg via INTRAVENOUS
  Filled 2014-05-18: qty 50

## 2014-05-18 MED ORDER — SCOPOLAMINE 1 MG/3DAYS TD PT72
MEDICATED_PATCH | TRANSDERMAL | Status: AC
  Filled 2014-05-18: qty 1

## 2014-05-18 MED ORDER — ONDANSETRON 4 MG PO TBDP
8.0000 mg | ORAL_TABLET | Freq: Once | ORAL | Status: AC
  Administered 2014-05-18: 8 mg via ORAL

## 2014-05-18 MED ORDER — ROCURONIUM BROMIDE 100 MG/10ML IV SOLN
INTRAVENOUS | Status: DC | PRN
  Administered 2014-05-18: 25 mg via INTRAVENOUS
  Administered 2014-05-18: 10 mg via INTRAVENOUS
  Administered 2014-05-18: 5 mg via INTRAVENOUS

## 2014-05-18 MED ORDER — LIDOCAINE HCL (CARDIAC) 20 MG/ML IV SOLN
INTRAVENOUS | Status: AC
  Filled 2014-05-18: qty 5

## 2014-05-18 MED ORDER — FENTANYL CITRATE 0.05 MG/ML IJ SOLN
INTRAMUSCULAR | Status: DC | PRN
  Administered 2014-05-18 (×3): 50 ug via INTRAVENOUS
  Administered 2014-05-18: 100 ug via INTRAVENOUS

## 2014-05-18 SURGICAL SUPPLY — 32 items
APPLICATOR COTTON TIP 6IN STRL (MISCELLANEOUS) ×2 IMPLANT
BAG SPEC RTRVL LRG 6X4 10 (ENDOMECHANICALS)
BLADE 15 SAFETY STRL DISP (BLADE) ×1 IMPLANT
BLADE SURG 11 STRL SS (BLADE) ×2 IMPLANT
CLOTH BEACON ORANGE TIMEOUT ST (SAFETY) ×2 IMPLANT
DRSG COVADERM PLUS 2X2 (GAUZE/BANDAGES/DRESSINGS) ×4 IMPLANT
DRSG OPSITE POSTOP 3X4 (GAUZE/BANDAGES/DRESSINGS) IMPLANT
DURAPREP 26ML APPLICATOR (WOUND CARE) ×2 IMPLANT
FORCEPS CUTTING 33CM 5MM (CUTTING FORCEPS) ×1 IMPLANT
GAS CARTRIDGE (MEDICAL GASES) IMPLANT
GLOVE BIO SURGEON STRL SZ7 (GLOVE) ×2 IMPLANT
GLOVE BIOGEL PI IND STRL 7.0 (GLOVE) ×1 IMPLANT
GLOVE BIOGEL PI INDICATOR 7.0 (GLOVE) ×1
GOWN STRL REUS W/TWL LRG LVL3 (GOWN DISPOSABLE) ×4 IMPLANT
NEEDLE INSUFFLATION 120MM (ENDOMECHANICALS) IMPLANT
NS IRRIG 1000ML POUR BTL (IV SOLUTION) ×2 IMPLANT
PACK LAPAROSCOPY BASIN (CUSTOM PROCEDURE TRAY) ×2 IMPLANT
POUCH SPECIMEN RETRIEVAL 10MM (ENDOMECHANICALS) IMPLANT
PROTECTOR NERVE ULNAR (MISCELLANEOUS) ×3 IMPLANT
SET IRRIG TUBING LAPAROSCOPIC (IRRIGATION / IRRIGATOR) IMPLANT
SHEARS HARMONIC ACE PLUS 36CM (ENDOMECHANICALS) ×1 IMPLANT
SUT VICRYL 0 ENDOLOOP (SUTURE) IMPLANT
SUT VICRYL 0 UR6 27IN ABS (SUTURE) ×4 IMPLANT
SUT VICRYL 4-0 PS2 18IN ABS (SUTURE) ×4 IMPLANT
TOWEL OR 17X24 6PK STRL BLUE (TOWEL DISPOSABLE) ×4 IMPLANT
TRAY FOLEY CATH 14FR (SET/KITS/TRAYS/PACK) ×2 IMPLANT
TROCAR BALLN 12MMX100 BLUNT (TROCAR) IMPLANT
TROCAR OPTI TIP 5M 100M (ENDOMECHANICALS) ×1 IMPLANT
TROCAR XCEL DIL TIP R 11M (ENDOMECHANICALS) ×1 IMPLANT
TROCAR XCEL NON-BLD 11X100MML (ENDOMECHANICALS) ×1 IMPLANT
TROCAR XCEL NON-BLD 5MMX100MML (ENDOMECHANICALS) ×2 IMPLANT
WATER STERILE IRR 1000ML POUR (IV SOLUTION) ×1 IMPLANT

## 2014-05-18 NOTE — ED Notes (Signed)
Pt c/o right side back pain since 1200 today Denies inj/truama, urinary sx, gyn sx Reports she was just laying on the bed when she felt pain Pain increases when sitting up Taking aleve w/no relief.

## 2014-05-18 NOTE — Discharge Instructions (Signed)
We have determined that your problem requires further evaluation in the emergency department.  We will take care of your transport there.  Once at the emergency department, you will be evaluated by a provider and they will order whatever treatment or tests they deem necessary.  We cannot guarantee that they will do any specific test or do any specific treatment.  ° °

## 2014-05-18 NOTE — H&P (Signed)
Linda Massey is an 33 y.o. female. Who presents from ED with abdominal pain who was found to be pregnant. Since Ectopic pregnancy was suspected, she was transferred here for evaluation by Linda Massey.   ED Note: Linda Massey is a 33 year old female who was well up until around noon today when she experienced sudden onset of right mid back pain that woke her up from sleep. The pain radiated around to her right flank area and into her right lower quadrant. The pain is 9/10 in intensity. She's had nausea and vomited 3 times she denies any fever or chills. The pain is not pleuritic. She denies any constipation, diarrhea, or blood in the stool. She denies any urinary symptoms. Her last menstrual period began last week and still going on today. The patient denies any sexual activity.  Pertinent Gynecological History: Menses: flow is moderate Bleeding:  Contraception: none DES exposure: unknown Blood transfusions: none Sexually transmitted diseases: no past history Previous GYN Procedures: C/S  Last mammogram: n/a Date:  Last pap: unknown Date:  OB History: G3, P2002   Menstrual History: Menarche age:  Patient's last menstrual period was 05/18/2014.    Past Medical History  Diagnosis Date  . Medical history non-contributory     Past Surgical History  Procedure Laterality Date  . Cesarean section    With Linda Linda Massey in 2003 for failure to progress In 2005, Repeat C/S with Linda Linda Massey for failed VBAC  History reviewed. No pertinent family history.  Social History:  reports that she quit smoking about 2 weeks ago. She does not have any smokeless tobacco history on file. She reports that she does not drink alcohol or use illicit drugs.  Allergies:  Allergies  Allergen Reactions  . Penicillins Hives    Prescriptions prior to admission  Medication Sig Dispense Refill  . naproxen sodium (ANAPROX) 220 MG tablet Take 440 mg by mouth daily as needed (for pain.).        Review of Systems   Constitutional: Negative for fever, chills and malaise/fatigue.  Gastrointestinal: Positive for nausea, vomiting and abdominal pain. Negative for diarrhea and constipation.  Neurological: Negative for dizziness and headaches.    Blood pressure 129/75, pulse 78, temperature 97.9 F (36.6 C), temperature source Oral, resp. rate 18, last menstrual period 05/18/2014. Physical Exam  Constitutional: She is oriented to person, place, and time. She appears well-developed and well-nourished. No distress.  HENT:  Head: Normocephalic.  Cardiovascular: Normal rate.   Respiratory: Effort normal.  GI: Soft. She exhibits mass. She exhibits no distension. There is tenderness. There is rebound and guarding.  Genitourinary:  Deferred No cultures needed per Linda Massey   Musculoskeletal: Normal range of motion.  Neurological: She is alert and oriented to person, place, and time.  Skin: Skin is warm and dry.  Psychiatric: She has a normal mood and affect.    Results for orders placed during the hospital encounter of 05/18/14 (from the past 24 hour(s))  HCG, QUANTITATIVE, PREGNANCY     Status: Abnormal   Collection Time    05/18/14  7:14 PM      Result Value Ref Range   hCG, Beta Chain, Linda LongestQuant, S [id number] (*) <5 mIU/mL  CBC     Status: Abnormal   Collection Time    05/18/14  7:22 PM      Result Value Ref Range   WBC 10.5  4.0 - 10.5 K/uL   RBC 3.94  3.87 - 5.11 MIL/uL   Hemoglobin 11.1 (*)  12.0 - 15.0 g/dL   HCT 16.1 (*) 09.6 - 04.5 %   MCV 83.8  78.0 - 100.0 fL   MCH 28.2  26.0 - 34.0 pg   MCHC 33.6  30.0 - 36.0 g/dL   RDW 40.9  81.1 - 91.4 %   Platelets 218  150 - 400 K/uL  ABO/RH     Status: None   Collection Time    05/18/14  7:22 PM      Result Value Ref Range   ABO/RH(D) A POS      No results found.  US Ob Transvaginal  05/18/2014   CLINICAL DATA:  Pain, spotting, pregnant  EXAM: OBSTETRIC <14 WK ULTRASOUND  TECHNIQUE: Transabdominal ultrasound was performed for evaluation of the  gestation as well as the maternal uterus and adnexal regions.  COMPARISON:  None.    FINDINGS: There is no intrauterine gestational sac. In the peripheral right fallopian tube at the junction with the right ovary, there is a gestational sac containing a yolk sac and a fetal pole with crown-rump length of 1.5 cm consistent with 7 weeks 6 days gestation. It demonstrates positive cardiac activity at 152 beats per min.                      There is a small volume of free fluid in the right adnexa.                      Right ovary is otherwise normal.                     Left ovary normal.                      Small to moderate volume of free fluid in the cul-de-sac of the pelvis noted.    IMPRESSION: Live ectopic pregnancy in the peripheral aspect of the right fallopian tube. Critical Value/emergent results were called by telephone at the time of interpretation on 05/18/2014 at 8:28 pm to Linda. Jacalyn Massey, who verbally acknowledged these results.     Electronically Signed   By: Linda Heir M.D.   On: 05/18/2014 20:28   Assessment/Plan: A:  Right Live Ectopic Pregnancy at [redacted]w[redacted]d size       P:  Linda Penne Lash notified       Plan to go to OR for treatment       Risks and benefits reviewed with patient by Linda Penne Lash.       [facility] 05/18/2014, 8:34 PM  Pt seen and examined.  Pt seen and examined.  Pt consented for Laparoscopy, right salpingectomy, and removal of ectopic pregnancy. Risks include but not limited to bleeding, infection, damage to intrabdominal organs, complications from anesthesia.  CBC    Component Value Date/Time   WBC 10.5 05/18/2014 1922   RBC 3.94 05/18/2014 1922   HGB 11.1* 05/18/2014 1922   HCT 33.0* 05/18/2014 1922   PLT 218 05/18/2014 1922   MCV 83.8 05/18/2014 1922   MCH 28.2 05/18/2014 1922   MCHC 33.6 05/18/2014 1922   RDW 12.9 05/18/2014 1922    Pt to OR when ready  Linda Massey.

## 2014-05-18 NOTE — ED Provider Notes (Signed)
Chief Complaint   Chief Complaint  Patient presents with  . Back Pain    History of Present Illness   Linda Massey is a 33 year old female who was well up until around noon today when she experienced sudden onset of right mid back pain that woke her up from sleep. The pain radiated around to her right flank area and into her right lower quadrant. The pain is 9/10 in intensity. She's had nausea and vomited 3 times she denies any fever or chills. The pain is not pleuritic. She denies any constipation, diarrhea, or blood in the stool. She denies any urinary symptoms. Her last menstrual period began last week and still going on today. The patient denies any sexual activity.  Review of Systems   Other than as noted above, the patient denies any of the following symptoms: Constitutional:  No fever, chills, weight loss or anorexia. Abdomen:  No nausea, vomiting, hematememesis, melena, diarrhea, or hematochezia. GU:  No dysuria, frequency, urgency, or hematuria. Gyn:  No vaginal discharge, itching, abnormal bleeding, dyspareunia, or pelvic pain.  PMFSH   Past medical history, family history, social history, meds, and allergies were reviewed. She's allergic to penicillin.  Physical Exam     Vital signs:  BP 108/74  Pulse 79  Temp(Src) 98.8 F (37.1 C)  Resp 16  SpO2 100%  LMP 05/18/2014 Gen:  Alert, oriented, in moderate distress. Lungs:  Breath sounds clear and equal bilaterally.  No wheezes, rales or rhonchi. Heart:  Regular rhythm.  No gallops or murmers.   Abdomen:  Soft, flat, nondistended. No organomegaly or mass. Bowel sounds were normally active. She has tenderness to palpation in the right flank area, right upper quadrant, and right lower quadrant with guarding and rebound in the right lower quadrant. Back: Moderate right CVA tenderness. Skin:  Clear, warm and dry.  No rash.  Labs   Results for orders placed during the hospital encounter of 05/18/14  POCT URINALYSIS DIP  (DEVICE)      Result Value Ref Range   Glucose, UA 100 (*) NEGATIVE mg/dL   Bilirubin Urine SMALL (*) NEGATIVE   Ketones, ur TRACE (*) NEGATIVE mg/dL   Specific Gravity, Urine 1.020  1.005 - 1.030   Hgb urine dipstick LARGE (*) NEGATIVE   pH 7.0  5.0 - 8.0   Protein, ur 30 (*) NEGATIVE mg/dL   Urobilinogen, UA 1.0  0.0 - 1.0 mg/dL   Nitrite NEGATIVE  NEGATIVE   Leukocytes, UA SMALL (*) NEGATIVE  POCT PREGNANCY, URINE      Result Value Ref Range   Preg Test, Ur POSITIVE (*) NEGATIVE   Course in Urgent Care Center   The following medications were given:  Medications  ondansetron (ZOFRAN-ODT) disintegrating tablet 8 mg (not administered)  HYDROmorphone (DILAUDID) injection 1 mg (not administered)  0.9 %  sodium chloride infusion (not administered)   Assessment   The encounter diagnosis was RLQ abdominal pain.  The history is suspicious for ruptured ectopic pregnancy. She may just be having a miscarriage. Other possibilities include kidney stone or appendicitis.  Plan     The patient was transferred to the ED via [facility] in stable condition.  Medical Decision Making:  33 year old female  Had sudden onset of right flank pain this afternoon which woke her from sleep.  Pain has gotten worse and has radiated to RLQ.  She's felt nauseated and vomited 3 times. On exam she is very tender in RLQ with guarding and rebound.  UA shows large Hg but she is on menses. Pregnancy test was positive.  [facility] for possibility of ruptured ectopic.  We will give Dilaudid 1 mg IM and Zofran ODT 8 mg SL.     Reuben Likesavid C Ember Henrikson, MD 05/18/14 [phone]

## 2014-05-18 NOTE — ED Notes (Signed)
[facility] did not have truck Administrator, [facility] has been dispatched

## 2014-05-18 NOTE — Op Note (Signed)
Linda MayotteLatoya S Luft PROCEDURE DATE: 05/18/2014  PREOPERATIVE DIAGNOSIS: Ruptured ectopic pregnancy POSTOPERATIVE DIAGNOSIS: Ruptured right fallopian tube ectopic pregnancy PROCEDURE: Laparoscopic right salpingectomy and removal of ectopic pregnancy SURGEON:  Dr. Elsie LincolnKelly Leggett ASSISTANT: Dr. Trevor Ihaevon Stephens ANESTHESIOLOGIST: Dr. Dana AllanAmy Cassidy  INDICATIONS: 33 y.o. [id number] at 757w6dhere for with ruptured ectopic pregnancy. On exam, she had stable vital signs, and an acute abdomen. Hgb 11, blood type A+. Patient was counseled regarding need for laparoscopic salpingectomy. Risks of surgery including bleeding which may require transfusion or reoperation, infection, injury to bowel or other surrounding organs, need for additional procedures including laparotomy and other postoperative/anesthesia complications were explained to patient.  Written informed consent was obtained.  FINDINGS:  moderate amount of hemoperitoneum estimated to be about 300 of blood and clots.  Dilated right fallopian tube containing ectopic gestation. Small normal appearing uterus, normal left fallopian tube, right ovary and left ovary.  ANESTHESIA: General  ESTIMATED BLOOD LOSS: 300 ml  SPECIMENS: right fallopian tube containing ectopic gestation COMPLICATIONS: None immediate  PROCEDURE IN DETAIL:  The patient was taken to the operating room where general anesthesia was administered and was found to be adequate.  She was placed in the dorsal lithotomy position, and was prepped and draped in a sterile manner.  A Foley catheter was inserted into her bladder and attached to constant drainage and a uterine manipulator was then advanced into the uterus .  After an adequate timeout was performed, attention was then turned to the patient's abdomen where a 10-mm skin incision was made on the umbilical fold.  The peritoneal cavity was entered bluntly under the direct visualization with the Optiview.  The peritoneal cavity was insufflated of carbon  dioxide gas.  . A survey of the patient's pelvis and abdomen revealed the findings as above.  Two 5-mm right lower quadrant ports were placed under direct visualization.  The Nezhat suction irrigator was then used to suction the hemoperitoneum and irrigate the pelvis.  Attention was then turned to the right fallopian tube which was grasped and ligated from the underlying mesosalpinx and uterine attachment using the Gyrus instrument.  Good hemostasis was noted.  The specimen was placed in an EndoCatch bag and removed from the abdomen intact.  The abdomen was desufflated, and all instruments were removed.  The fascial incision of the 10-mm site was reapproximated with 0 Vicryl figure-of-eight stiches.  The umbilical skin incision was closed with 3-0 Vircryl in a subcuticular fashion.  The lower quadrant ports were closed with Dermabond.  The patient tolerated the procedures well.  All instruments, needles, and sponge counts were correct x 2. The patient was taken to the recovery room in stable condition.   LEGGETT,KELLY H. 05/18/2014 10:53 PM

## 2014-05-18 NOTE — Anesthesia Preprocedure Evaluation (Signed)
Anesthesia Evaluation  Patient identified by MRN, date of birth, ID band Patient awake    Reviewed: Allergy & Precautions, H&P , NPO status , Patient's Chart, lab work & pertinent test results, reviewed documented beta blocker date and time   History of Anesthesia Complications Negative for: history of anesthetic complications  Airway Mallampati: III TM Distance: >3 FB Neck ROM: full    Dental  (+) Teeth Intact   Pulmonary neg pulmonary ROS, former smoker (quit 9 days ago),  breath sounds clear to auscultation        Cardiovascular negative cardio ROS  Rhythm:regular Rate:Normal     Neuro/Psych negative neurological ROS  negative psych ROS   GI/Hepatic negative GI ROS, Neg liver ROS,   Endo/Other  negative endocrine ROS  Renal/GU negative Renal ROS     Musculoskeletal   Abdominal   Peds  Hematology negative hematology ROS (+)   Anesthesia Other Findings Last ate full meal at 12 pm, water at 6:45 pm  Reproductive/Obstetrics (+) Pregnancy (h/o c/s x2, now with living ectopic 2472w5d in right tube)                           Anesthesia Physical Anesthesia Plan  ASA: II and emergent  Anesthesia Plan: General ETT and Rapid Sequence   Post-op Pain Management:    Induction:   Airway Management Planned:   Additional Equipment:   Intra-op Plan:   Post-operative Plan:   Informed Consent: I have reviewed the patients History and Physical, chart, labs and discussed the procedure including the risks, benefits and alternatives for the proposed anesthesia with the patient or authorized representative who has indicated his/her understanding and acceptance.     Plan Discussed with: Surgeon and CRNA  Anesthesia Plan Comments:         Anesthesia Quick Evaluation

## 2014-05-18 NOTE — MAU Note (Signed)
[facility]

## 2014-05-18 NOTE — Anesthesia Procedure Notes (Signed)
Procedure Name: Intubation Date/Time: 05/18/2014 9:31 PM Performed by: Graciela HusbandsFUSSELL, Andros Channing O Pre-anesthesia Checklist: Emergency Drugs available, Patient identified, Timeout performed, Suction available and Patient being monitored Patient Re-evaluated:Patient Re-evaluated prior to inductionOxygen Delivery Method: Circle system utilized Preoxygenation: Pre-oxygenation with 100% oxygen Intubation Type: Rapid sequence, IV induction and Cricoid Pressure applied Laryngoscope Size: Mac and 3 Grade View: Grade I Tube type: Oral Tube size: 7.0 mm Number of attempts: 1 Airway Equipment and Method: Patient positioned with wedge pillow and Stylet Secured at: 21.5 cm Tube secured with: Tape Dental Injury: Teeth and Oropharynx as per pre-operative assessment

## 2014-05-18 NOTE — Transfer of Care (Signed)
Immediate Anesthesia Transfer of Care Note  Patient: Linda MayotteLatoya S Furukawa  Procedure(s) Performed: Procedure(s): LAPAROSCOPY DIAGNOSTIC (Right)  Patient Location: PACU  Anesthesia Type:General  Level of Consciousness: sedated  Airway & Oxygen Therapy: Patient Spontanous Breathing and Patient connected to nasal cannula oxygen  Post-op Assessment: Report given to PACU RN and Post -op Vital signs reviewed and stable  Post vital signs: stable  Complications: No apparent anesthesia complications

## 2014-05-19 MED ORDER — FENTANYL CITRATE 0.05 MG/ML IJ SOLN: 25.0000 ug | INTRAMUSCULAR | Status: DC | PRN

## 2014-05-19 MED ORDER — MEPERIDINE HCL 25 MG/ML IJ SOLN: 6.2500 mg | INTRAMUSCULAR | Status: DC | PRN

## 2014-05-19 MED ORDER — ACETAMINOPHEN 160 MG/5ML PO SOLN: 975.0000 mg | Freq: Four times a day (QID) | ORAL | Status: DC | PRN

## 2014-05-19 MED ORDER — METOCLOPRAMIDE HCL 5 MG/ML IJ SOLN
10.0000 mg | Freq: Once | INTRAMUSCULAR | Status: AC
  Administered 2014-05-19: 10 mg via INTRAVENOUS

## 2014-05-19 MED ORDER — KETOROLAC TROMETHAMINE 30 MG/ML IJ SOLN: 15.0000 mg | Freq: Once | INTRAMUSCULAR | Status: AC | PRN

## 2014-05-19 MED ORDER — PROMETHAZINE HCL 25 MG/ML IJ SOLN: 6.2500 mg | INTRAMUSCULAR | Status: DC | PRN

## 2014-05-19 NOTE — Discharge Instructions (Signed)
Ruptured Ectopic Pregnancy °An ectopic pregnancy is when the fertilized egg attaches (implants) outside the uterus. Most ectopic pregnancies occur in the fallopian tube. Rarely do ectopic pregnancies occur on the ovary, intestine, pelvis, or cervix. An ectopic pregnancy does not have the ability to develop into a normal, healthy baby.  °A ruptured ectopic pregnancy is one in which the fallopian tube gets torn or bursts and results in internal bleeding. Often there is intense abdominal pain, and sometimes, vaginal bleeding. Having an ectopic pregnancy can be a life-threatening experience. If left untreated, this dangerous condition can lead to a blood transfusion, abdominal surgery, or even death.  °CAUSES  °Damage to the fallopian tubes is the suspected cause in most ectopic pregnancies.  °RISK FACTORS °Depending on your circumstances, the amount of risk of having an ectopic pregnancy will vary. There are 3 categories that may help you identify whether you are potentially at risk. °High Risk °· You have gone through infertility treatment. °· You have had a previous ectopic pregnancy. °· You have had previous tubal surgery. °· You have had previous surgery to have the fallopian tubes tied (tubal ligation). °· You have tubal problems or diseases. °· You have been exposed to DES. DES is a medicine that was used until 1971 and had effects on babies whose mothers took the medicine. °· You become pregnant while using an intrauterine device (IUD) for birth control.  °Moderate Risk °· You have a history of infertility. °· You have a history of a sexually transmitted infection (STI). °· You have a history of pelvic inflammatory disease (PID). °· You have scarring from endometriosis. °· You have multiple sexual partners. °· You smoke.  °Low Risk °· You have had previous pelvic surgery. °· You use vaginal douching. °· You became sexually active before 31 years of age. °SYMPTOMS °An ectopic pregnancy should be suspected in  anyone who has missed a period and has abdominal pain or bleeding. °· You may experience normal pregnancy symptoms, such as: °¨ Nausea. °¨ Tiredness. °¨ Breast tenderness. °· Symptoms that are not normal include: °¨ Pain with intercourse. °¨ Irregular vaginal bleeding or spotting. °¨ Cramping or pain on one side, or in the lower abdomen. °¨ Fast heartbeat. °¨ Passing out while having a bowel movement. °· Symptoms of a ruptured ectopic pregnancy and internal bleeding may include: °¨ Sudden, severe pain in the abdomen and pelvis. °¨ Dizziness or fainting. °¨ Pain in the shoulder area. °DIAGNOSIS  °Tests that may be performed include: °· A pregnancy test. °· An ultrasound. °· Testing the specific level of pregnancy hormone in the bloodstream. °· Taking a sample of uterus tissue (dilation and curettage, D&C). °· Surgery to perform a visual exam of the inside of the abdomen using a lighted tube (laparoscopy). °TREATMENT  °Laparoscopic surgery or abdominal surgery is recommended for a ruptured ectopic pregnancy.  °· The whole fallopian tube may need to be removed (salpingectomy). °· If the tube is not too damaged, the tube may be saved, and the pregnancy will be surgically removed. In time, the tube may still function. °· If you have lost a lot of blood, you may need a blood transfusion. °· You may receive a Rho (D) immune globulin shot if you are Rh negative and the father is Rh positive, or if you do not know the Rh type of the father. This is to prevent problems with any future pregnancy. °SEEK IMMEDIATE MEDICAL CARE IF:  °You have any symptoms of an ectopic or ruptured ectopic pregnancy. This   is a medical emergency. MAKE SURE YOU:  Understand these instructions.  Will watch your condition.  Will get help right away if you are not doing well or get worse. Document Released: 09/25/2000 Document Revised: 10/03/2013 Document Reviewed: 07/10/2013 [facility]Exit[facility] Patient Information 2015 [place]ExitCare, [place]. This information  is not intended to replace advice given to you by your health care provider. Make sure you discuss any questions you have with your health care provider. Laparotomy Care After Refer to this sheet in the next few weeks. These instructions provide you with information on caring for yourself after your procedure. Your caregiver may also give you more specific instructions. Your treatment has been planned according to current medical practices, but problems sometimes occur. Call your caregiver if you have any problems or questions after your procedure. HOME CARE INSTRUCTIONS ACTIVITY  Rest as much as possible the first two weeks at home.  Avoid strenuous activity such as heavy lifting (more than 10 pounds), pushing, or pulling. Limit stair climbing to once or twice a day for the first week, then slowly increase this activity.  Take frequent rest periods throughout the day.  Talk with your caregiver about when you may resume your usual physical activity.  You need to be out of bed and walking as much as possible. This decreases the chance of:  Blood clots.  Pneumonia. NUTRITION  You can resume your normal diet once you regain bowel function.  Drink plenty of fluids (6-8 glasses a day or as instructed by your caregiver).  Eat a well-balanced diet.  Daily portions of food from the meat (protein), milk, vegetable, and bread groups are necessary for your health. ELIMINATION It is very important not to strain during bowel movements. If constipation should occur, you may:  Take a mild laxative.  Add fruit and bran to your diet.  Drink more fluids. HYGIENE  Take showers, not baths, until 4-6 weeks after surgery.  If your incision is closed, you may take a shower or tub bath. FEVER If you feel feverish or have shaking chills, take your temperature. If it is 102 F (38.9 C), call your caregiver. The fever may mean there is an infection. PAIN CONTROL  Mild discomfort may  occur.  Only take over-the-counter or prescription medicines for pain, discomfort, or fever as directed by your caregiver. Take any prescribed medicines exactly as directed. INCISION CARE  Keep your incision site clean with soap and water.  Do not use a dressing unless your cut (incision) from surgery is draining or irritated.  If you have small adhesive strips in place and they do not fall off within 10 days, carefully peel them off.  Check your incision and surrounding area daily for any redness, swelling, discoloration, heavy drainage, or separation of the skin. SEXUAL INTERCOURSE Do not have sexual intercourse until after your follow-up appointment, unless your caregiver tells you otherwise. SEEK MEDICAL CARE IF:   You are unable to tolerate food or drinks.  You are unable to pass gas or have a bowel movement.  Your pain becomes more severe or is not relieved with medicines.  You have redness, swelling, discoloration, heavy drainage, or separation of the skin at the incision site. Document Released: 05/12/2004 Document Revised: 09/14/2012 Document Reviewed: 09/27/2007 [facility] LandingExitCare Patient Information 2015 [place]ExitCare, [place]LLC. This information is not intended to replace advice given to you by your health care provider. Make sure you discuss any questions you have with your health care provider.

## 2014-05-19 NOTE — Anesthesia Postprocedure Evaluation (Signed)
  Anesthesia Post-op Note  Anesthesia Post Note  Patient: Linda MayotteLatoya S Massey  Procedure(s) Performed: Procedure(s) (LRB): LAPAROSCOPY DIAGNOSTIC (Right)  Anesthesia type: General  Patient location: PACU  Post pain: Pain level controlled  Post assessment: Post-op Vital signs reviewed  Last Vitals:  Filed Vitals:   05/19/14 0200  BP: 108/62  Pulse: 80  Temp:   Resp: 12    Post vital signs: Reviewed  Level of consciousness: sedated  Complications: No apparent anesthesia complications

## 2014-05-19 NOTE — Progress Notes (Signed)
Pt more alert but nauseated, Dr. Rodman Pickleassidy made aware and orders received for fluid bolus and anti-emetic. Pt still falling back to sleep quickly, family member at bedside.

## 2014-05-19 NOTE — Progress Notes (Signed)
Several attempts made to wake patient up. Family member brought in to visit with patient and wake pt up. Back to sleep easily, will continue to stimulate and wake pt. vss.

## 2014-05-21 ENCOUNTER — Encounter (HOSPITAL_COMMUNITY): Payer: Self-pay | Admitting: Obstetrics & Gynecology

## 2014-05-21 ENCOUNTER — Inpatient Hospital Stay (HOSPITAL_COMMUNITY)
Admission: AD | Admit: 2014-05-21 | Discharge: 2014-05-21 | Disposition: A | Discharge status: Home / Self Care | Payer: Self-pay | Source: Ambulatory Visit | Attending: Obstetrics & Gynecology | Admitting: Obstetrics & Gynecology

## 2014-05-21 DIAGNOSIS — Z87891 Personal history of nicotine dependence: Secondary | ICD-10-CM | POA: Insufficient documentation

## 2014-05-21 DIAGNOSIS — S301XXA Contusion of abdominal wall, initial encounter: Secondary | ICD-10-CM | POA: Insufficient documentation

## 2014-05-21 DIAGNOSIS — IMO0002 Reserved for concepts with insufficient information to code with codable children: Secondary | ICD-10-CM | POA: Insufficient documentation

## 2014-05-21 LAB — CBC
HCT: 28.7 % — ABNORMAL LOW (ref 36.0–46.0)
HEMOGLOBIN: 9.3 g/dL — AB (ref 12.0–15.0)
MCH: 27.9 pg (ref 26.0–34.0)
MCHC: 32.4 g/dL (ref 30.0–36.0)
MCV: 86.2 fL (ref 78.0–100.0)
Platelets: 223 10*3/uL (ref 150–400)
RBC: 3.33 MIL/uL — ABNORMAL LOW (ref 3.87–5.11)
RDW: 13 % (ref 11.5–15.5)
WBC: 8 10*3/uL (ref 4.0–10.5)

## 2014-05-21 NOTE — Discharge Instructions (Signed)
Contusion °A contusion is a deep bruise. Contusions are the result of an injury that caused bleeding under the skin. The contusion may turn blue, purple, or yellow. Minor injuries will give you a painless contusion, but more severe contusions may stay painful and swollen for a few weeks.  °CAUSES  °A contusion is usually caused by a blow, trauma, or direct force to an area of the body. °SYMPTOMS  °· Swelling and redness of the injured area. °· Bruising of the injured area. °· Tenderness and soreness of the injured area. °· Pain. °DIAGNOSIS  °The diagnosis can be made by taking a history and physical exam. An X-ray, CT scan, or MRI may be needed to determine if there were any associated injuries, such as fractures. °TREATMENT  °Specific treatment will depend on what area of the body was injured. In general, the best treatment for a contusion is resting, icing, elevating, and applying cold compresses to the injured area. Over-the-counter medicines may also be recommended for pain control. Ask your caregiver what the best treatment is for your contusion. °HOME CARE INSTRUCTIONS  °· Put ice on the injured area. °¨ Put ice in a plastic bag. °¨ Place a towel between your skin and the bag. °¨ Leave the ice on for 15-20 minutes, 3-4 times a day, or as directed by your health care provider. °· Only take over-the-counter or prescription medicines for pain, discomfort, or fever as directed by your caregiver. Your caregiver may recommend avoiding anti-inflammatory medicines (aspirin, ibuprofen, and naproxen) for 48 hours because these medicines may increase bruising. °· Rest the injured area. °· If possible, elevate the injured area to reduce swelling. °SEEK IMMEDIATE MEDICAL CARE IF:  °· You have increased bruising or swelling. °· You have pain that is getting worse. °· Your swelling or pain is not relieved with medicines. °MAKE SURE YOU:  °· Understand these instructions. °· Will watch your condition. °· Will get help right  away if you are not doing well or get worse. °Document Released: 07/08/2005 Document Revised: 10/03/2013 Document Reviewed: 08/03/2011 °ExitCare® Patient Information ©2015 [employer], LLC. This information is not intended to replace advice given to you by your health care provider. Make sure you discuss any questions you have with your health care provider. ° °

## 2014-05-21 NOTE — MAU Note (Signed)
Had laparoscopic surgery on Friday for ectopic pregnancy; unsure if tube was removed. Has bruising on lower abdomen that was concerned about. Denies vaginal bleeding or pain.

## 2014-05-21 NOTE — MAU Provider Note (Signed)
First Provider Initiated Contact with Patient 05/21/14 2256      Chief Complaint:  Post-op Problem and Bleeding/Bruising  She had a laparoscopic removal of ruptured right ectopic with salpingectomy 05/18/14.  She woke up this morning and noticed a small bruise below the umbilicus, which has continued to spread throughout the day.  No extra pain/bleeding.  Past Medical History  Diagnosis Date  . Medical history non-contributory     Past Surgical History  Procedure Laterality Date  . Cesarean section    . Laparoscopy Right 05/18/2014    Procedure: LAPAROSCOPY DIAGNOSTIC;  Surgeon: Lesly DukesKelly H Leggett, MD;  Location: [facility];  Service: Gynecology;  Laterality: Right;  . Appendectomy      pt thinks her appendex was removed    History reviewed. No pertinent family history.  History  Substance Use Topics  . Smoking status: Former Smoker    Quit date: 05/04/2014  . Smokeless tobacco: Not on file  . Alcohol Use: No    Allergies:  Allergies  Allergen Reactions  . Penicillins Hives    Prescriptions prior to admission  Medication Sig Dispense Refill  . ibuprofen (ADVIL,MOTRIN) 600 MG tablet Take 1 tablet (600 mg total) by mouth every 6 (six) hours as needed.  30 tablet  1  . oxyCODONE-acetaminophen (PERCOCET/ROXICET) 5-325 MG per tablet Take 1 tablet by mouth every 4 (four) hours as needed.  30 tablet  0     Review of Systems   Constitutional: Negative for fever and chills Eyes: Negative for visual disturbances Respiratory: Negative for shortness of breath, dyspnea Cardiovascular: Negative for chest pain or palpitations  Gastrointestinal: Negative for vomiting, diarrhea and constipation Genitourinary: Negative for dysuria and urgency Musculoskeletal: Negative for back pain, joint pain, myalgias  Neurological: Negative for dizziness and headaches     Physical Exam   Blood pressure 108/63, pulse 61, temperature 98.8 F (37.1 C), temperature source Oral, resp. rate 16, height  5\' 6"  (1.676 m), weight 109.589 kg (241 lb 9.6 oz), last menstrual period 05/11/2014, SpO2 100.00%, unknown if currently breastfeeding.  General: General appearance - alert, well appearing, and in no distress and oriented to person, place, and time Chest - clear to auscultation, no wheezes, rales or rhonchi, symmetric air entry Heart - normal rate and regular rhythm Abdomen - purple superficial bruising from umbilicus down to pubic bone and laterally across entire abdomen.  Nontender.  Wound site at umbilicus and LLQ dry without erythema. Pelvic - examination not indicated Extremities - no pedal edema noted   Labs: Results for orders placed during the hospital encounter of 05/21/14 (from the past 24 hour(s))  CBC   Collection Time    05/21/14  8:59 PM      Result Value Ref Range   WBC 8.0  4.0 - 10.5 K/uL   RBC 3.33 (*) 3.87 - 5.11 MIL/uL   Hemoglobin 9.3 (*) 12.0 - 15.0 g/dL   HCT 82.928.7 (*) 56.236.0 - 13.046.0 %   MCV 86.2  78.0 - 100.0 fL   MCH 27.9  26.0 - 34.0 pg   MCHC 32.4  30.0 - 36.0 g/dL   RDW 86.513.0  78.411.5 - 69.615.5 %   Platelets 223  150 - 400 K/uL     Assessment:  Post op bruising, SQ tissue;  Discussed with Dr. Marice Potterove  Plan: Took a photo with pt's phone; abdominal binder/ice prn. F/U post op appt (had not been scheduled) in 10 days  CRESENZO-DISHMAN,Yenty Bloch

## 2014-06-01 ENCOUNTER — Encounter: Payer: Self-pay | Admitting: Obstetrics & Gynecology

## 2014-06-01 ENCOUNTER — Ambulatory Visit (INDEPENDENT_AMBULATORY_CARE_PROVIDER_SITE_OTHER): Payer: Self-pay | Admitting: Obstetrics & Gynecology

## 2014-06-01 VITALS — BP 98/63 | HR 69 | Temp 97.8°F | Ht 68.0 in | Wt 233.9 lb

## 2014-06-01 DIAGNOSIS — Z09 Encounter for follow-up examination after completed treatment for conditions other than malignant neoplasm: Secondary | ICD-10-CM

## 2014-06-01 DIAGNOSIS — Z4889 Encounter for other specified surgical aftercare: Secondary | ICD-10-CM

## 2014-06-01 NOTE — Progress Notes (Signed)
Pt states she has one area on abdomen that did not heal under Band-Aid.

## 2014-06-01 NOTE — Progress Notes (Signed)
Patient ID: Linda MayotteLatoya S Massey, female   DOB: [birth year], 33 y.o.   MRN: [id number] Subjective: 05/18/14 L?S for ruptured right tubal pregnancy     Linda MayotteLatoya S Massey is a 33 y.o. female who presents to the clinic 2 weeks status post laparoscopy and removal right tube for ectopic pregnancy, Dr. Penne LashLeggett for ruptured ectopic. Eating a regular diet without difficulty. Bowel movements are normal. The patient is not having any pain.  The following portions of the patient's history were reviewed and updated as appropriate: allergies, current medications, past family history, past medical history, past social history, past surgical history and problem list.  Review of Systems Pertinent items are noted in HPI.    Objective:    BP 98/63  Pulse 69  Temp(Src) 97.8 F (36.6 C)  Ht 5\' 8"  (1.727 m)  Wt 233 lb 14.4 oz (106.096 kg)  BMI 35.57 kg/m2  LMP 05/11/2014 General:  alert, cooperative and no distress  Abdomen: soft, bowel sounds active, non-tender, bruised  Incision:   healing well, no drainage, no erythema, no hernia, no dehiscence, incision well approximated     Assessment:    Doing well postoperatively. Operative findings again reviewed. Pathology report discussed.    Plan:    1. Continue any current medications. 2. Wound care discussed. 3. Activity restrictions: none 4. Anticipated return to work: now. 5. Follow up: prn  Adam PhenixJames G Arnold, MD 06/01/2014

## 2014-06-01 NOTE — Patient Instructions (Signed)
°Ectopic Pregnancy °An ectopic pregnancy is when the fertilized egg attaches (implants) outside the uterus. Most ectopic pregnancies occur in the fallopian tube. Rarely do ectopic pregnancies occur on the ovary, intestine, pelvis, or cervix. In an ectopic pregnancy, the fertilized egg does not have the ability to develop into a normal, healthy baby.  °A ruptured ectopic pregnancy is one in which the fallopian tube gets torn or bursts and results in internal bleeding. Often there is intense abdominal pain, and sometimes, vaginal bleeding. Having an ectopic pregnancy can be life threatening. If left untreated, this dangerous condition can lead to a blood transfusion, abdominal surgery, or even death. °CAUSES  °Damage to the fallopian tubes is the suspected cause in most ectopic pregnancies.  °RISK FACTORS °Depending on your circumstances, the risk of having an ectopic pregnancy will vary. The level of risk can be divided into three categories. °High Risk °· You have gone through infertility treatment. °· You have had a previous ectopic pregnancy. °· You have had previous tubal surgery. °· You have had previous surgery to have the fallopian tubes tied (tubal ligation). °· You have tubal problems or diseases. °· You have been exposed to DES. DES is a medicine that was used until 1971 and had effects on babies whose mothers took the medicine. °· You become pregnant while using an intrauterine device (IUD) for birth control.  °Moderate Risk °· You have a history of infertility. °· You have a history of a sexually transmitted infection (STI). °· You have a history of pelvic inflammatory disease (PID). °· You have scarring from endometriosis. °· You have multiple sexual partners. °· You smoke.  °Low Risk °· You have had previous pelvic surgery. °· You use vaginal douching. °· You became sexually active before 31 years of age. °SIGNS AND SYMPTOMS  °An ectopic pregnancy should be suspected in anyone who has missed a period  and has abdominal pain or bleeding. °· You may experience normal pregnancy symptoms, such as: °¨ Nausea. °¨ Tiredness. °¨ Breast tenderness. °· Other symptoms may include: °¨ Pain with intercourse. °¨ Irregular vaginal bleeding or spotting. °¨ Cramping or pain on one side or in the lower abdomen. °¨ Fast heartbeat. °¨ Passing out while having a bowel movement. °· Symptoms of a ruptured ectopic pregnancy and internal bleeding may include: °¨ Sudden, severe pain in the abdomen and pelvis. °¨ Dizziness or fainting. °¨ Pain in the shoulder area. °DIAGNOSIS  °Tests that may be performed include: °· A pregnancy test. °· An ultrasound test. °· Testing the specific level of pregnancy hormone in the bloodstream. °· Taking a sample of uterus tissue (dilation and curettage, D&C). °· Surgery to perform a visual exam of the inside of the abdomen using a thin, lighted tube with a tiny camera on the end (laparoscope). °TREATMENT  °An injection of a medicine called methotrexate may be given. This medicine causes the pregnancy tissue to be absorbed. It is given if: °· The diagnosis is made early. °· The fallopian tube has not ruptured. °· You are considered to be a good candidate for the medicine. °Usually, pregnancy hormone blood levels are checked after methotrexate treatment. This is to be sure the medicine is effective. It may take 4-6 weeks for the pregnancy to be absorbed (though most pregnancies will be absorbed by 3 weeks). °Surgical treatment may be needed. A laparoscope may be used to remove the pregnancy tissue. If severe internal bleeding occurs, a cut (incision) may be made in the lower abdomen (laparotomy), and the ectopic   pregnancy is removed. This stops the bleeding. Part of the fallopian tube, or the whole tube, may be removed as well (salpingectomy). After surgery, pregnancy hormone tests may be done to be sure there is no pregnancy tissue left. You may receive a Rho (D) immune globulin shot if you are Rh negative  and the father is Rh positive, or if you do not know the Rh type of the father. This is to prevent problems with any future pregnancy. °SEEK IMMEDIATE MEDICAL CARE IF:  °You have any symptoms of an ectopic pregnancy. This is a medical emergency. °MAKE SURE YOU: °· Understand these instructions. °· Will watch your condition. °· Will get help right away if you are not doing well or get worse. °Document Released: 11/05/2004 Document Revised: 02/12/2014 Document Reviewed: 04/27/2013 °ExitCare® Patient Information ©2015 [employer], [employer]. This information is not intended to replace advice given to you by your health care provider. Make sure you discuss any questions you have with your health care provider. ° ° °

## 2014-08-07 ENCOUNTER — Emergency Department (INDEPENDENT_AMBULATORY_CARE_PROVIDER_SITE_OTHER)
Admission: EM | Admit: 2014-08-07 | Discharge: 2014-08-07 | Disposition: A | Discharge status: Home / Self Care | Payer: Self-pay | Source: Home / Self Care | Attending: Family Medicine | Admitting: Family Medicine

## 2014-08-07 ENCOUNTER — Encounter (HOSPITAL_COMMUNITY): Payer: Self-pay | Admitting: Emergency Medicine

## 2014-08-07 DIAGNOSIS — N390 Urinary tract infection, site not specified: Secondary | ICD-10-CM

## 2014-08-07 DIAGNOSIS — M545 Low back pain, unspecified: Secondary | ICD-10-CM

## 2014-08-07 LAB — POCT URINALYSIS DIP (DEVICE)
Bilirubin Urine: NEGATIVE
Glucose, UA: NEGATIVE mg/dL
KETONES UR: NEGATIVE mg/dL
Nitrite: NEGATIVE
PROTEIN: 100 mg/dL — AB
Specific Gravity, Urine: 1.025 (ref 1.005–1.030)
UROBILINOGEN UA: 0.2 mg/dL (ref 0.0–1.0)
pH: 6 (ref 5.0–8.0)

## 2014-08-07 LAB — POCT PREGNANCY, URINE: Preg Test, Ur: NEGATIVE

## 2014-08-07 MED ORDER — CIPROFLOXACIN HCL 500 MG PO TABS: 500.0000 mg | ORAL_TABLET | Freq: Two times a day (BID) | ORAL | Status: DC

## 2014-08-07 NOTE — ED Notes (Signed)
Reports on set of lower back pain shortly after intercourse.  States has to use restroom every thirty minutes and has orgasm with urinating.    Denies fever,n/v/d

## 2014-08-07 NOTE — Discharge Instructions (Signed)
Thank you for coming in today. Take cipro twice daily for 1 week.  Come back as needed.  Go to the ER if you worsen.  Come back or go to the emergency room if you notice new weakness new numbness problems walking or bowel or bladder problems.  Urinary Tract Infection Urinary tract infections (UTIs) can develop anywhere along your urinary tract. Your urinary tract is your body's drainage system for removing wastes and extra water. Your urinary tract includes two kidneys, two ureters, a bladder, and a urethra. Your kidneys are a pair of bean-shaped organs. Each kidney is about the size of your fist. They are located below your ribs, one on each side of your spine. CAUSES Infections are caused by microbes, which are microscopic organisms, including fungi, viruses, and bacteria. These organisms are so small that they can only be seen through a microscope. Bacteria are the microbes that most commonly cause UTIs. SYMPTOMS  Symptoms of UTIs may vary by age and gender of the patient and by the location of the infection. Symptoms in young women typically include a frequent and intense urge to urinate and a painful, burning feeling in the bladder or urethra during urination. Older women and men are more likely to be tired, shaky, and weak and have muscle aches and abdominal pain. A fever may mean the infection is in your kidneys. Other symptoms of a kidney infection include pain in your back or sides below the ribs, nausea, and vomiting. DIAGNOSIS To diagnose a UTI, your caregiver will ask you about your symptoms. Your caregiver also will ask to provide a urine sample. The urine sample will be tested for bacteria and white blood cells. White blood cells are made by your body to help fight infection. TREATMENT  Typically, UTIs can be treated with medication. Because most UTIs are caused by a bacterial infection, they usually can be treated with the use of antibiotics. The choice of antibiotic and length of  treatment depend on your symptoms and the type of bacteria causing your infection. HOME CARE INSTRUCTIONS  If you were prescribed antibiotics, take them exactly as your caregiver instructs you. Finish the medication even if you feel better after you have only taken some of the medication.  Drink enough water and fluids to keep your urine clear or pale yellow.  Avoid caffeine, tea, and carbonated beverages. They tend to irritate your bladder.  Empty your bladder often. Avoid holding urine for long periods of time.  Empty your bladder before and after sexual intercourse.  After a bowel movement, women should cleanse from front to back. Use each tissue only once. SEEK MEDICAL CARE IF:   You have back pain.  You develop a fever.  Your symptoms do not begin to resolve within 3 days. SEEK IMMEDIATE MEDICAL CARE IF:   You have severe back pain or lower abdominal pain.  You develop chills.  You have nausea or vomiting.  You have continued burning or discomfort with urination. MAKE SURE YOU:   Understand these instructions.  Will watch your condition.  Will get help right away if you are not doing well or get worse. Document Released: 07/08/2005 Document Revised: 03/29/2012 Document Reviewed: 11/06/2011 [facility] Patient Information 2015 [place]ExitCare, [place]LL[place]. This information is not intended to replace advice given to you by your health care provider. Make sure you discuss any questions you have with your health care provider.

## 2014-08-07 NOTE — ED Provider Notes (Addendum)
Linda Massey is a 33 y.o. female who presents to Urgent Care today for low back pain. Patient had rough consensual sex yesterday evening. This morning she notes mild low back pain and urinary urgency and frequency. However unusually patient experiences an orgasm with urination since this morning. She denies any vaginal pain or discharge. She denies any radiating pain weakness or numbness. She feels well otherwise; she has not tried any medications   Past Medical History  Diagnosis Date  . Medical history non-contributory    History  Substance Use Topics  . Smoking status: Former Smoker    Quit date: 05/04/2014  . Smokeless tobacco: Not on file  . Alcohol Use: No   ROS as above Medications: No current facility-administered medications for this encounter.   Current Outpatient Prescriptions  Medication Sig Dispense Refill  . ciprofloxacin (CIPRO) 500 MG tablet Take 1 tablet (500 mg total) by mouth 2 (two) times daily.  14 tablet  0  . ibuprofen (ADVIL,MOTRIN) 600 MG tablet Take 1 tablet (600 mg total) by mouth every 6 (six) hours as needed.  30 tablet  1    Exam:  BP 93/56  Pulse 91  Temp(Src) 98.3 F (36.8 C) (Oral)  Resp 16  SpO2 98%  LMP 07/24/2014  Breastfeeding? No Gen: Well NAD HEENT: EOMI,  MMM Lungs: Normal work of breathing. CTABL Heart: RRR no MRG Abd: NABS, Soft. Nondistended, Nontender no CVA angle tenderness to percussion Exts: Brisk capillary refill, warm and well perfused.  Back: Nontender to spinal midline. Tender to palpation bilateral lumbar paraspinals. Normal back range of motion. Negative straight leg raise and Fabere test bilaterally. Reflexes strength and sensation are intact and equal distally. Normal gait.  Results for orders placed during the hospital encounter of 08/07/14 (from the past 24 hour(s))  POCT URINALYSIS DIP (DEVICE)     Status: Abnormal   Collection Time    08/07/14 11:55 AM      Result Value Ref Range   Glucose, UA NEGATIVE   NEGATIVE mg/dL   Bilirubin Urine NEGATIVE  NEGATIVE   Ketones, ur NEGATIVE  NEGATIVE mg/dL   Specific Gravity, Urine 1.025  1.005 - 1.030   Hgb urine dipstick LARGE (*) NEGATIVE   pH 6.0  5.0 - 8.0   Protein, ur 100 (*) NEGATIVE mg/dL   Urobilinogen, UA 0.2  0.0 - 1.0 mg/dL   Nitrite NEGATIVE  NEGATIVE   Leukocytes, UA MODERATE (*) NEGATIVE  POCT PREGNANCY, URINE     Status: None   Collection Time    08/07/14 11:56 AM      Result Value Ref Range   Preg Test, Ur NEGATIVE  NEGATIVE   No results found.  Assessment and Plan: 33 y.o. female with urinary tract infection is the most likely explanation. Urine culture pending. Patient declines pelvic exam today. If not better will return for pelvic exam. Follow-up with PCP as needed. Treatment with Cipro given patient's penicillin allergy. Discussed warning signs or symptoms. Please see discharge instructions. Patient expresses understanding.     Rodolph BongEvan S [name], MD 08/07/14 1215  Rodolph BongEvan S [name], MD 08/07/14 1215

## 2014-08-10 LAB — URINE CULTURE
Colony Count: >=100000
Special Requests: NORMAL

## 2014-08-10 NOTE — ED Notes (Signed)
Urine culture: >100,000 colonies Proteus Mirabilis.  Pt.adequately treated with Cipro. Vassie MoselleYork, Linda Massey 08/10/2014

## 2014-08-13 ENCOUNTER — Encounter (HOSPITAL_COMMUNITY): Payer: Self-pay | Admitting: Emergency Medicine

## 2015-06-23 ENCOUNTER — Emergency Department (HOSPITAL_COMMUNITY)
Admission: EM | Admit: 2015-06-23 | Discharge: 2015-06-23 | Disposition: A | Discharge status: Home / Self Care | Payer: Self-pay | Attending: Emergency Medicine | Admitting: Emergency Medicine

## 2015-06-23 ENCOUNTER — Encounter (HOSPITAL_COMMUNITY): Payer: Self-pay | Admitting: Emergency Medicine

## 2015-06-23 DIAGNOSIS — K2970 Gastritis, unspecified, without bleeding: Secondary | ICD-10-CM

## 2015-06-23 DIAGNOSIS — Z87891 Personal history of nicotine dependence: Secondary | ICD-10-CM | POA: Insufficient documentation

## 2015-06-23 DIAGNOSIS — Z880 Allergy status to penicillin: Secondary | ICD-10-CM | POA: Insufficient documentation

## 2015-06-23 DIAGNOSIS — A084 Viral intestinal infection, unspecified: Secondary | ICD-10-CM | POA: Insufficient documentation

## 2015-06-23 DIAGNOSIS — Z3202 Encounter for pregnancy test, result negative: Secondary | ICD-10-CM | POA: Insufficient documentation

## 2015-06-23 LAB — HEPATIC FUNCTION PANEL
ALT: 13 U/L — ABNORMAL LOW (ref 14–54)
AST: 17 U/L (ref 15–41)
Albumin: 3.7 g/dL (ref 3.5–5.0)
Alkaline Phosphatase: 65 U/L (ref 38–126)
Bilirubin, Direct: <0.1 mg/dL — ABNORMAL LOW (ref 0.1–0.5)
Total Bilirubin: 0.3 mg/dL (ref 0.3–1.2)
Total Protein: 7.5 g/dL (ref 6.5–8.1)

## 2015-06-23 LAB — LIPASE, BLOOD: Lipase: 21 U/L — ABNORMAL LOW (ref 22–51)

## 2015-06-23 LAB — PREGNANCY, URINE: Preg Test, Ur: NEGATIVE

## 2015-06-23 LAB — URINALYSIS, ROUTINE W REFLEX MICROSCOPIC
Bilirubin Urine: NEGATIVE
Glucose, UA: NEGATIVE mg/dL
Ketones, ur: 15 mg/dL — AB
Leukocytes, UA: NEGATIVE
Nitrite: NEGATIVE
Protein, ur: NEGATIVE mg/dL
Specific Gravity, Urine: 1.025 (ref 1.005–1.030)
Urobilinogen, UA: 1 mg/dL (ref 0.0–1.0)
pH: 5.5 (ref 5.0–8.0)

## 2015-06-23 LAB — CBC WITH DIFFERENTIAL/PLATELET
Basophils Absolute: 0 K/uL (ref 0.0–0.1)
Basophils Relative: 0 % (ref 0–1)
Eosinophils Absolute: 0.1 K/uL (ref 0.0–0.7)
Eosinophils Relative: 1 % (ref 0–5)
HCT: 39.1 % (ref 36.0–46.0)
Hemoglobin: 12.5 g/dL (ref 12.0–15.0)
Lymphocytes Relative: 21 % (ref 12–46)
Lymphs Abs: 1.7 K/uL (ref 0.7–4.0)
MCH: 26.3 pg (ref 26.0–34.0)
MCHC: 32 g/dL (ref 30.0–36.0)
MCV: 82.3 fL (ref 78.0–100.0)
Monocytes Absolute: 0.5 K/uL (ref 0.1–1.0)
Monocytes Relative: 6 % (ref 3–12)
Neutro Abs: 6.1 K/uL (ref 1.7–7.7)
Neutrophils Relative %: 72 % (ref 43–77)
Platelets: 277 K/uL (ref 150–400)
RBC: 4.75 MIL/uL (ref 3.87–5.11)
RDW: 12.9 % (ref 11.5–15.5)
WBC: 8.5 K/uL (ref 4.0–10.5)

## 2015-06-23 LAB — BASIC METABOLIC PANEL WITH GFR
Anion gap: 9 (ref 5–15)
BUN: 7 mg/dL (ref 6–20)
CO2: 25 mmol/L (ref 22–32)
Calcium: 8.9 mg/dL (ref 8.9–10.3)
Chloride: 100 mmol/L — ABNORMAL LOW (ref 101–111)
Creatinine, Ser: 0.75 mg/dL (ref 0.44–1.00)
GFR calc Af Amer: >60 mL/min
GFR calc non Af Amer: >60 mL/min
Glucose, Bld: 106 mg/dL — ABNORMAL HIGH (ref 65–99)
Potassium: 3.7 mmol/L (ref 3.5–5.1)
Sodium: 134 mmol/L — ABNORMAL LOW (ref 135–145)

## 2015-06-23 LAB — URINE MICROSCOPIC-ADD ON

## 2015-06-23 MED ORDER — SODIUM CHLORIDE 0.9 % IV BOLUS (SEPSIS)
1000.0000 mL | Freq: Once | INTRAVENOUS | Status: AC
  Administered 2015-06-23: 1000 mL via INTRAVENOUS

## 2015-06-23 MED ORDER — ONDANSETRON HCL 4 MG PO TABS: 4.0000 mg | ORAL_TABLET | Freq: Four times a day (QID) | ORAL | Status: AC

## 2015-06-23 MED ORDER — ONDANSETRON HCL 4 MG/2ML IJ SOLN
4.0000 mg | Freq: Once | INTRAMUSCULAR | Status: AC
  Administered 2015-06-23: 4 mg via INTRAVENOUS
  Filled 2015-06-23: qty 2

## 2015-06-23 NOTE — ED Notes (Addendum)
Per Pt: Pt reports dizziness, nausea and vomiting starting 2 weeks ago, lasting a week.  Dizziness, N/V started again this morning.  Pt reports having similar issues last year, and was diagnosed with a miscarriage.  Pt reports menstrual cycle normally starts on the first of the month, and has not yet started this month.  Pt reports she did have a period last month, however it was light, and was shorter duration than normal. Pt reports sharp "pulling" pains in stomach region.

## 2015-06-23 NOTE — Discharge Instructions (Signed)
Gastritis, Adult Gastritis is soreness and puffiness (inflammation) of the lining of the stomach. If you do not get help, gastritis can cause bleeding and sores (ulcers) in the stomach. HOME CARE   Only take medicine as told by your doctor.  If you were given antibiotic medicines, take them as told. Finish the medicines even if you start to feel better.  Drink enough fluids to keep your pee (urine) clear or pale yellow.  Avoid foods and drinks that make your problems worse. Foods you may want to avoid include:  Caffeine or alcohol.  Chocolate.  Mint.  Garlic and onions.  Spicy foods.  Citrus fruits, including oranges, lemons, or limes.  Food containing tomatoes, including sauce, chili, salsa, and pizza.  Fried and fatty foods.  Eat small meals throughout the day instead of large meals. GET HELP RIGHT AWAY IF:   You have black or dark red poop (stools).  You throw up (vomit) blood. It may look like coffee grounds.  You cannot keep fluids down.  Your belly (abdominal) pain gets worse.  You have a fever.  You do not feel better after 1 week.  You have any other questions or concerns. MAKE SURE YOU:   Understand these instructions.  Will watch your condition.  Will get help right away if you are not doing well or get worse. Document Released: 03/16/2008 Document Revised: 12/21/2011 Document Reviewed: 11/11/2011 [facility] Patient Information 2015 [place], [place]. This information is not intended to replace advice given to you by your health care provider. Make sure you discuss any questions you have with your health care provider.   Return to emergency department if loss of consciousness, persistent vomiting, blood in vomit, diarrhea, abnormal vaginal bleeding or discharge, fevers occur.

## 2015-06-23 NOTE — ED Provider Notes (Signed)
CSN: [id number]     Arrival date & time 06/23/15  4098 History   First MD Initiated Contact with Patient 06/23/15 1128     Chief Complaint  Patient presents with  . Dizziness  . Nausea  . Emesis     (Consider location/radiation/quality/duration/timing/severity/associated sxs/prior Treatment) HPI Comments: Linda Massey is a 34 y.o F with no significant past medical history who presents to the emergency department today complaining of dizziness, nausea and vomiting starting 2 weeks ago lasting one week. She states that her symptoms resolved for one week and came back today. Patient reports one episode of vomiting this morning, nonbloody and nonbilious. No diarrhea. Patient states that her menstrual cycle typically start on the first day of the month and she has not yet had her period this month. Patient states that he had similar symptoms last year and was diagnosed with a miscarriage. Patient states that this morning she felt a sharp, brief pain in lower abdomen. Pain did not radiate. Denies dysuria, hematuria, abnormal discharge, fever, chills, chest pain, shortness of breath, actually smiling, syncope, HA.   Patient is a 34 y.o. female presenting with dizziness and vomiting. The history is provided by the patient.  Dizziness Associated symptoms: vomiting   Emesis   Past Medical History  Diagnosis Date  . Medical history non-contributory    Past Surgical History  Procedure Laterality Date  . Cesarean section    . Laparoscopy Right 05/18/2014    Procedure: LAPAROSCOPY DIAGNOSTIC;  Surgeon: Lesly Dukes, MD;  Location: [facility];  Service: Gynecology;  Laterality: Right;  . Appendectomy      pt thinks her appendex was removed   No family history on file. Social History  Substance Use Topics  . Smoking status: Former Smoker    Quit date: 05/04/2014  . Smokeless tobacco: None  . Alcohol Use: No   OB History    Gravida Para Term Preterm AB TAB SAB Ectopic Multiple Living   3 2 2  1    1  2      Review of Systems  Gastrointestinal: Positive for vomiting.  Neurological: Positive for dizziness.  All other systems reviewed and are negative.     Allergies  Penicillins  Home Medications   Prior to Admission medications   Medication Sig Start Date End Date Taking? Authorizing Provider  ibuprofen (ADVIL,MOTRIN) 600 MG tablet Take 1 tablet (600 mg total) by mouth every 6 (six) hours as needed. 05/18/14  Yes Lesly Dukes, MD  ondansetron (ZOFRAN) 4 MG tablet Take 1 tablet (4 mg total) by mouth every 6 (six) hours. 06/23/15   Andrena Margerum Tripp Kash [place], PA-C   BP 108/66 mmHg  Pulse 73  Temp(Src) 97.8 F (36.6 C) (Oral)  Resp 16  Ht 5\' 6"  (1.676 m)  Wt 263 lb (119.296 kg)  BMI 42.47 kg/m2  SpO2 100%  LMP 05/15/2015 Physical Exam  Constitutional: She is oriented to person, place, and time. She appears well-developed and well-nourished. No distress.  HENT:  Head: Normocephalic and atraumatic.  Mouth/Throat: Oropharynx is clear and moist. No oropharyngeal exudate.  Eyes: Conjunctivae and EOM are normal. Pupils are equal, round, and reactive to light. Right eye exhibits no discharge. Left eye exhibits no discharge. No scleral icterus.  Neck: Normal range of motion. Neck supple.  Cardiovascular: Normal rate, regular rhythm, normal heart sounds and intact distal pulses.  Exam reveals no gallop and no friction rub.   No murmur heard. Pulmonary/Chest: Effort normal and breath sounds normal. No  respiratory distress. She has no wheezes. She has no rales. She exhibits no tenderness.  Abdominal: Soft. Bowel sounds are normal. She exhibits no distension and no mass. There is no tenderness. There is no rebound and no guarding.  Musculoskeletal: Normal range of motion. She exhibits no edema or tenderness.  Lymphadenopathy:    She has no cervical adenopathy.  Neurological: She is alert and oriented to person, place, and time. No cranial nerve deficit.  Strength 5/5 throughout. No  sensory deficits. No gait abnormality.    Skin: Skin is warm and dry. No rash noted. She is not diaphoretic. No erythema. No pallor.  Psychiatric: She has a normal mood and affect. Her behavior is normal.  Nursing note and vitals reviewed.   ED Course  Procedures (including critical care time)  Pt given IV fluid and zofran. Reports feeling much better after treatment.  Pt requests discharge  Labs Review Labs Reviewed  URINALYSIS, ROUTINE W REFLEX MICROSCOPIC (NOT AT [facility]) - Abnormal; Notable for the following:    Hgb urine dipstick LARGE (*)    Ketones, ur 15 (*)    All other components within normal limits  BASIC METABOLIC PANEL - Abnormal; Notable for the following:    Sodium 134 (*)    Chloride 100 (*)    Glucose, Bld 106 (*)    All other components within normal limits  HEPATIC FUNCTION PANEL - Abnormal; Notable for the following:    ALT 13 (*)    Bilirubin, Direct <0.1 (*)    All other components within normal limits  LIPASE, BLOOD - Abnormal; Notable for the following:    Lipase 21 (*)    All other components within normal limits  URINE CULTURE  PREGNANCY, URINE  CBC WITH DIFFERENTIAL/PLATELET  URINE MICROSCOPIC-ADD ON    Imaging Review No results found. I have personally reviewed and evaluated these images and lab results as part of my medical decision-making.   EKG Interpretation None      MDM   Final diagnoses:  Viral gastritis    Pt in NAD. Afebrile. VSS. Pt no longer symptomatic. Lipase neg. Urine preg neg. LFTs wnl. Acute abdomen, hepatitis, pancreatitis , infection unlikely. UA neg, WBC wnl. Symptoms likely due to a viral gastritis. Recommend rest, hydration. Will give PO zofran for home. Return precautions outlined in patient discharge instructions.      Lester Kinsman [place], PA-C 06/23/15 1546  Richardean Canal, MD 06/26/15 [phone]

## 2015-06-23 NOTE — ED Notes (Signed)
Pt. Stated, I all of a sudden felt dizzy and nausea.  The same thing happened 2 weeks. Ago.

## 2015-06-23 NOTE — ED Notes (Signed)
Hepatic function and lipase to be added on per [place] from the lab.

## 2015-06-23 NOTE — ED Notes (Signed)
Reviewed d/c instructions and medications with patient.  Pt verbalized understanding.

## 2015-06-25 LAB — URINE CULTURE

## 2016-01-17 IMAGING — US US OB COMP LESS 14 WK
1 series · 14 of 28 positions shown · non-contrast
Comparison: None.

CLINICAL DATA: Pain, spotting, pregnant

EXAM:
OBSTETRIC <14 WK ULTRASOUND
TECHNIQUE: Transabdominal ultrasound was performed for evaluation of the
gestation as well as the maternal uterus and adnexal regions.

[Series 1: us ob comp less 14 wks · 52 acquisitions, 14 frames shown]
[im 2/52]
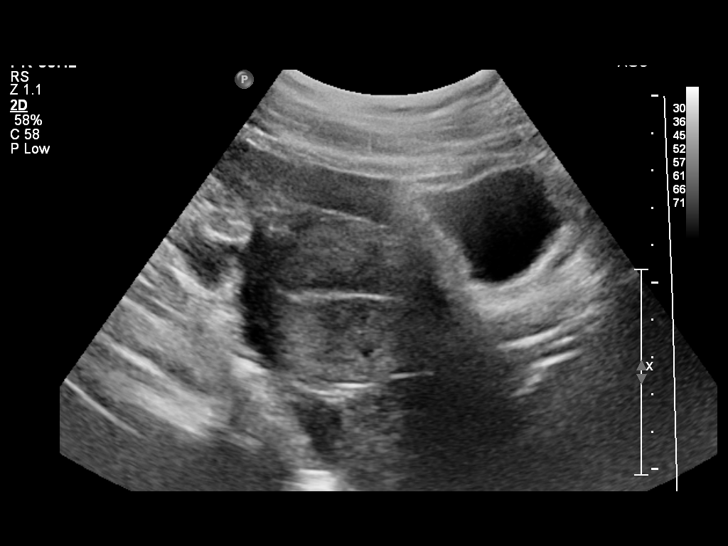
[im 6/52]
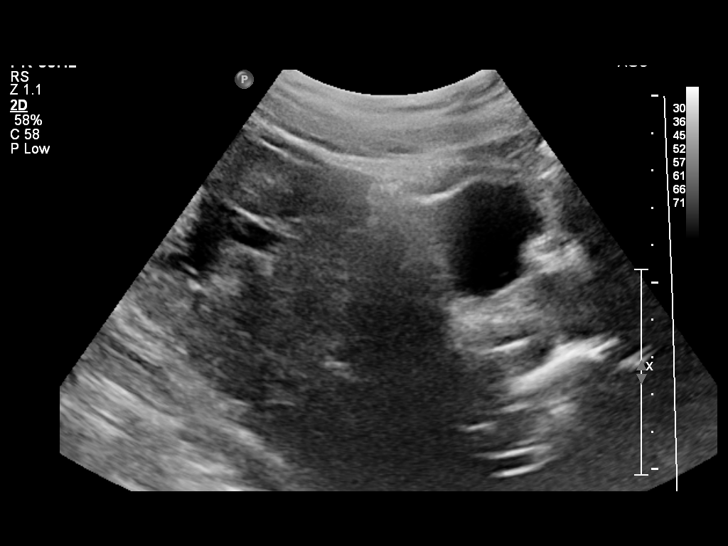
[im 10/52]
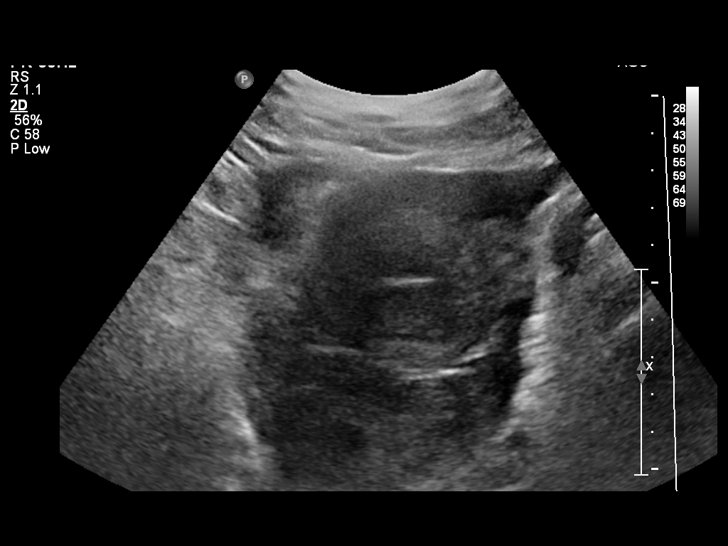
[im 14/52]
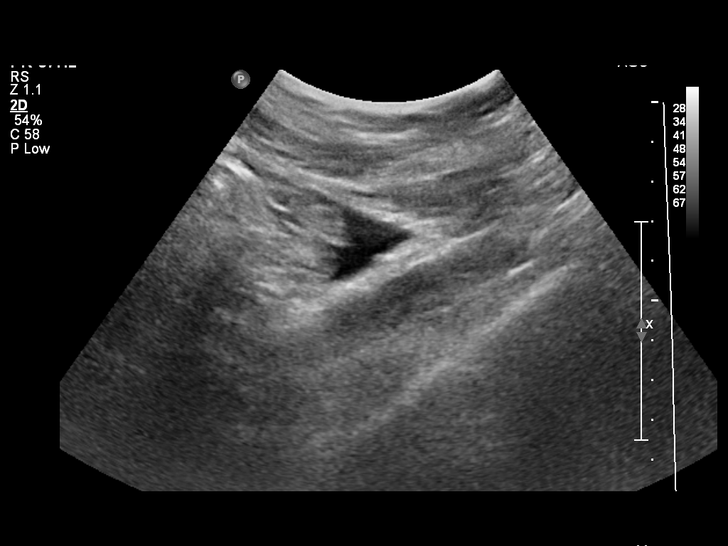
[im 18/52]
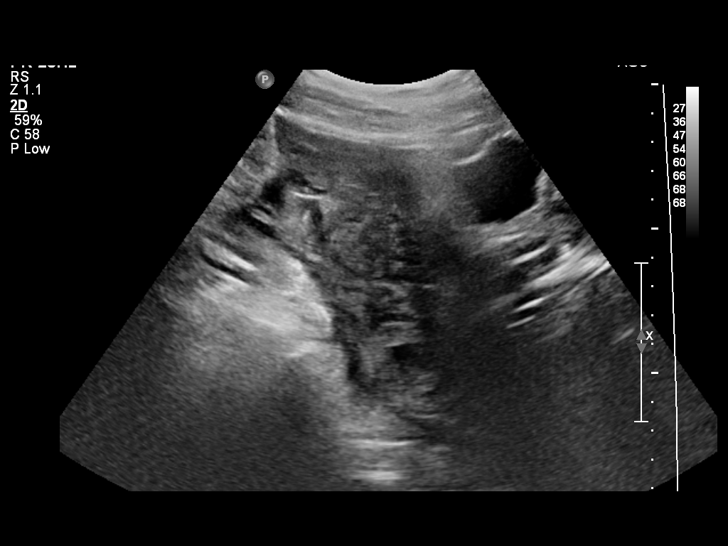
[im 21/52]
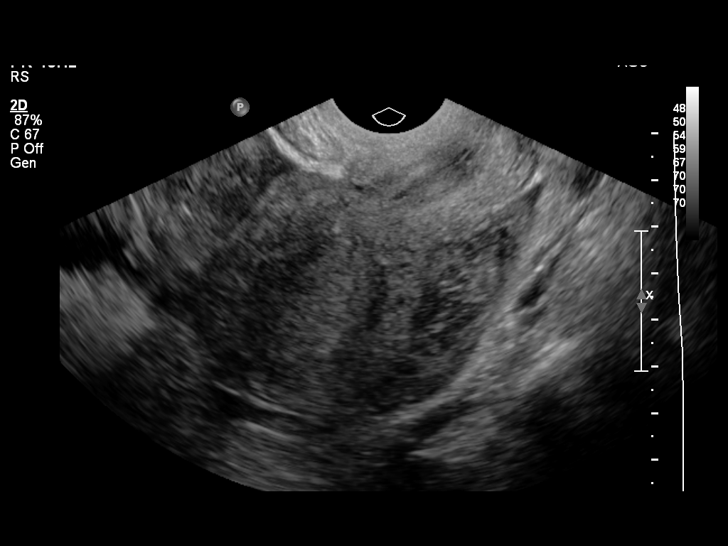
[im 25/52]
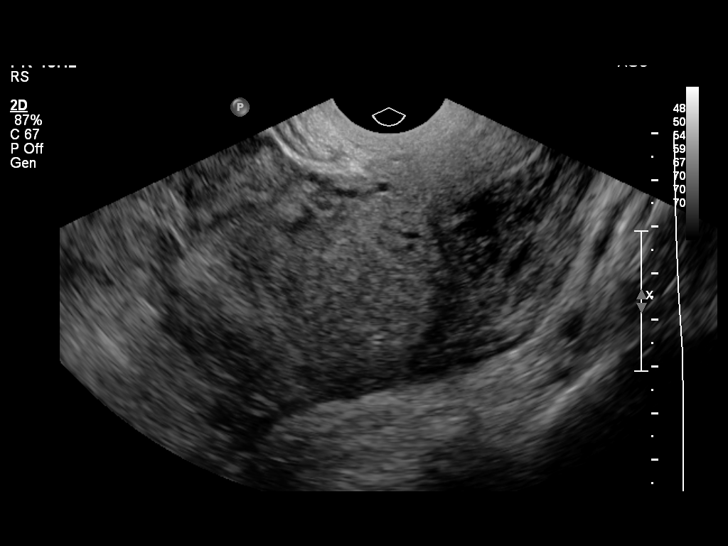
[im 29/52]
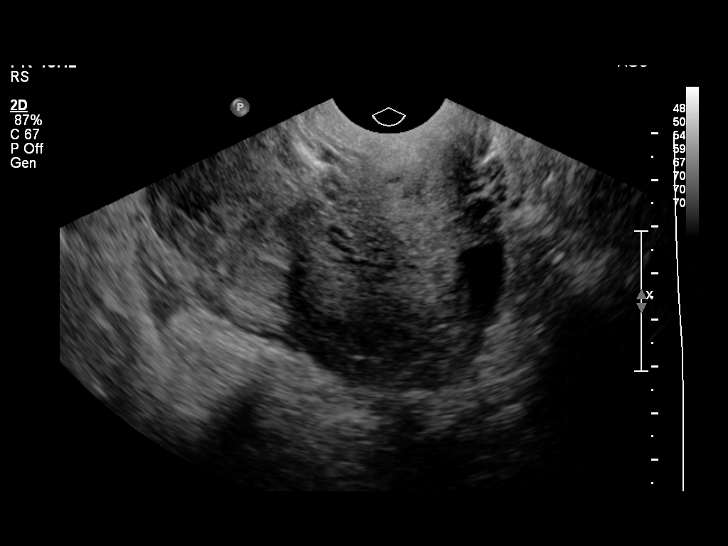
[im 33/52]
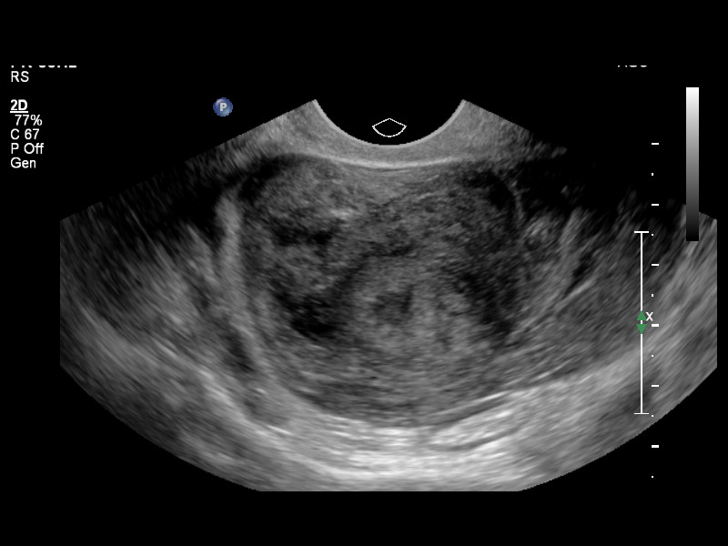
[im 36/52]
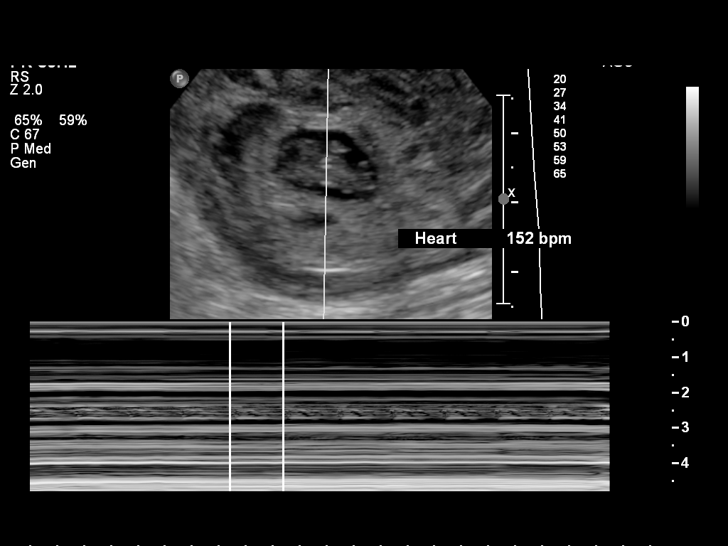
[im 40/52]
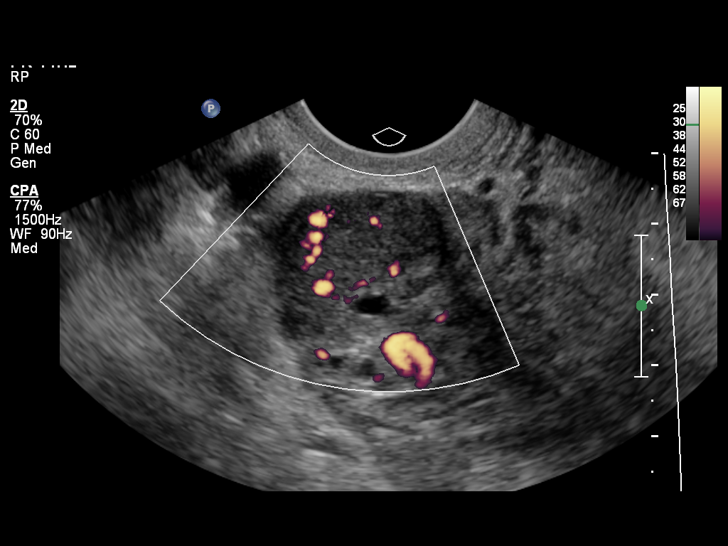
[im 44/52]
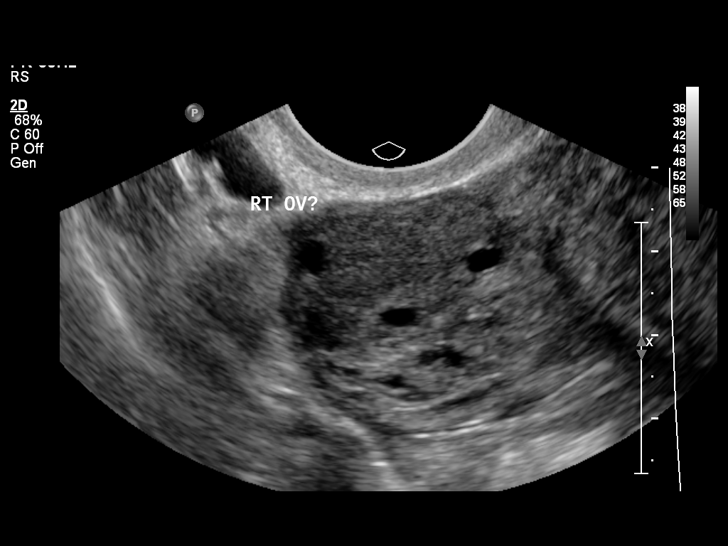
[im 48/52]
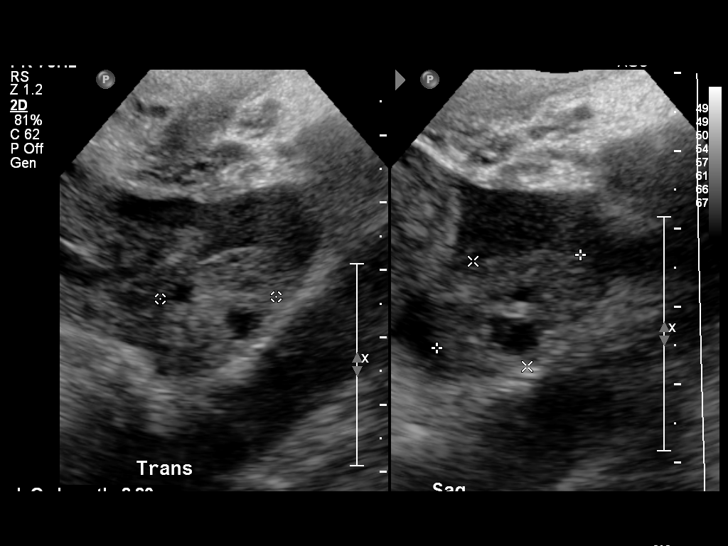
[im 52/52]
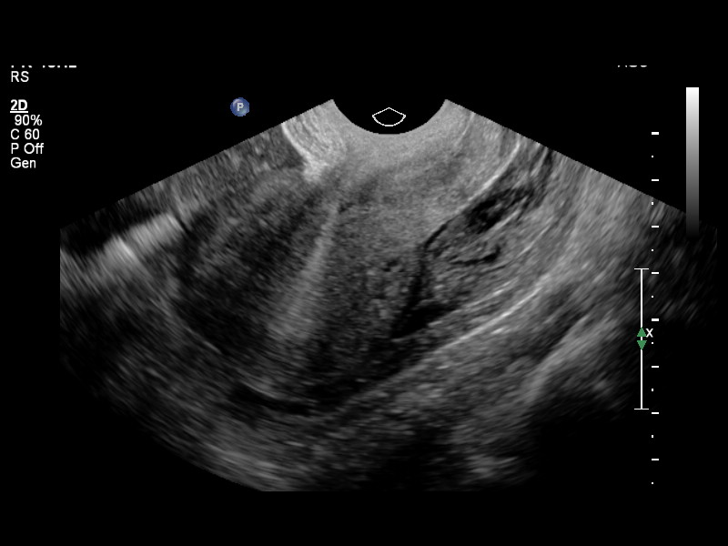

[14 of 28 positions shown; findings below may reference images not displayed]

FINDINGS: There is no intrauterine gestational sac. In the peripheral right
fallopian tube at the junction with the right ovary, there is a
gestational sac containing a yolk sac and a fetal pole with
crown-rump length of 1.5 cm consistent with 7 weeks 6 days
gestation. It demonstrates positive cardiac activity at 152 beats
per min. There is a small volume of free fluid in the right adnexa.
Right ovary is otherwise normal. Left ovary normal. Small to
moderate volume of free fluid in the cul-de-sac of the pelvis noted.
IMPRESSION: Live ectopic pregnancy in the peripheral aspect of the right
fallopian tube. Critical Value/emergent results were called by
telephone at the time of interpretation on 05/18/2014 at [DATE] to
Dr. Jean-Didier, who verbally acknowledged these results.
